# Patient Record
Sex: Female | Born: 1935 | Race: White | Hispanic: No | Marital: Married | Smoking: Never smoker
Health system: Southern US, Community
[De-identification: ages and names within clinical notes are randomized; demographics above are authoritative.]

## PROBLEM LIST (undated history)

## (undated) DIAGNOSIS — C801 Malignant (primary) neoplasm, unspecified: Secondary | ICD-10-CM

## (undated) DIAGNOSIS — E039 Hypothyroidism, unspecified: Secondary | ICD-10-CM

## (undated) DIAGNOSIS — D649 Anemia, unspecified: Secondary | ICD-10-CM

## (undated) DIAGNOSIS — I509 Heart failure, unspecified: Secondary | ICD-10-CM

## (undated) DIAGNOSIS — I10 Essential (primary) hypertension: Secondary | ICD-10-CM

## (undated) DIAGNOSIS — N189 Chronic kidney disease, unspecified: Secondary | ICD-10-CM

## (undated) DIAGNOSIS — K219 Gastro-esophageal reflux disease without esophagitis: Secondary | ICD-10-CM

## (undated) DIAGNOSIS — I82409 Acute embolism and thrombosis of unspecified deep veins of unspecified lower extremity: Secondary | ICD-10-CM

## (undated) DIAGNOSIS — M1990 Unspecified osteoarthritis, unspecified site: Secondary | ICD-10-CM

## (undated) HISTORY — PX: ABDOMINAL HYSTERECTOMY: SHX81

## (undated) HISTORY — PX: CHOLECYSTECTOMY: SHX55

## (undated) HISTORY — PX: EYE SURGERY: SHX253

## (undated) HISTORY — PX: JOINT REPLACEMENT: SHX530

---

## 1998-07-19 DIAGNOSIS — I82409 Acute embolism and thrombosis of unspecified deep veins of unspecified lower extremity: Secondary | ICD-10-CM

## 1998-07-19 HISTORY — DX: Acute embolism and thrombosis of unspecified deep veins of unspecified lower extremity: I82.409

## 2004-10-21 ENCOUNTER — Inpatient Hospital Stay: Payer: Self-pay | Admitting: Unknown Physician Specialty

## 2004-10-27 ENCOUNTER — Encounter: Payer: Self-pay | Admitting: Internal Medicine

## 2004-11-16 ENCOUNTER — Encounter: Payer: Self-pay | Admitting: Internal Medicine

## 2004-12-08 ENCOUNTER — Ambulatory Visit: Payer: Self-pay | Admitting: Unknown Physician Specialty

## 2005-02-18 ENCOUNTER — Ambulatory Visit: Payer: Self-pay | Admitting: Internal Medicine

## 2006-02-25 ENCOUNTER — Ambulatory Visit: Payer: Self-pay | Admitting: Internal Medicine

## 2006-03-01 ENCOUNTER — Ambulatory Visit: Payer: Self-pay | Admitting: Internal Medicine

## 2006-07-28 ENCOUNTER — Other Ambulatory Visit: Payer: Self-pay

## 2006-08-02 ENCOUNTER — Inpatient Hospital Stay: Payer: Self-pay | Admitting: Unknown Physician Specialty

## 2007-03-07 ENCOUNTER — Ambulatory Visit: Payer: Self-pay | Admitting: Internal Medicine

## 2007-04-05 ENCOUNTER — Ambulatory Visit: Payer: Self-pay | Admitting: Internal Medicine

## 2007-07-10 ENCOUNTER — Ambulatory Visit: Payer: Self-pay | Admitting: Urology

## 2008-04-08 ENCOUNTER — Ambulatory Visit: Payer: Self-pay | Admitting: Internal Medicine

## 2008-10-15 ENCOUNTER — Ambulatory Visit: Payer: Self-pay | Admitting: Urology

## 2008-11-01 ENCOUNTER — Ambulatory Visit: Payer: Self-pay | Admitting: Rheumatology

## 2008-12-03 ENCOUNTER — Inpatient Hospital Stay: Payer: Self-pay | Admitting: Surgery

## 2009-04-15 ENCOUNTER — Ambulatory Visit: Payer: Self-pay | Admitting: Internal Medicine

## 2009-09-04 ENCOUNTER — Ambulatory Visit: Payer: Self-pay | Admitting: Ophthalmology

## 2009-09-16 ENCOUNTER — Ambulatory Visit: Payer: Self-pay | Admitting: Ophthalmology

## 2010-04-16 ENCOUNTER — Ambulatory Visit: Payer: Self-pay | Admitting: Internal Medicine

## 2010-06-03 ENCOUNTER — Ambulatory Visit: Payer: Self-pay | Admitting: Gastroenterology

## 2010-09-23 ENCOUNTER — Ambulatory Visit: Payer: Self-pay | Admitting: Urology

## 2011-05-05 ENCOUNTER — Ambulatory Visit: Payer: Self-pay | Admitting: Internal Medicine

## 2012-01-26 ENCOUNTER — Ambulatory Visit: Payer: Self-pay

## 2012-05-05 ENCOUNTER — Ambulatory Visit: Payer: Self-pay | Admitting: Internal Medicine

## 2013-05-07 ENCOUNTER — Ambulatory Visit: Payer: Self-pay | Admitting: Internal Medicine

## 2014-01-05 ENCOUNTER — Ambulatory Visit: Payer: Self-pay | Admitting: Internal Medicine

## 2014-05-13 ENCOUNTER — Ambulatory Visit: Payer: Self-pay | Admitting: Internal Medicine

## 2015-01-08 DIAGNOSIS — M1990 Unspecified osteoarthritis, unspecified site: Secondary | ICD-10-CM | POA: Diagnosis not present

## 2015-01-08 DIAGNOSIS — I10 Essential (primary) hypertension: Secondary | ICD-10-CM | POA: Diagnosis not present

## 2015-01-08 DIAGNOSIS — K219 Gastro-esophageal reflux disease without esophagitis: Secondary | ICD-10-CM | POA: Diagnosis not present

## 2015-01-08 DIAGNOSIS — E079 Disorder of thyroid, unspecified: Secondary | ICD-10-CM | POA: Diagnosis not present

## 2015-01-15 DIAGNOSIS — M159 Polyosteoarthritis, unspecified: Secondary | ICD-10-CM | POA: Diagnosis not present

## 2015-01-15 DIAGNOSIS — I10 Essential (primary) hypertension: Secondary | ICD-10-CM | POA: Diagnosis not present

## 2015-01-15 DIAGNOSIS — E559 Vitamin D deficiency, unspecified: Secondary | ICD-10-CM | POA: Diagnosis not present

## 2015-01-15 DIAGNOSIS — D649 Anemia, unspecified: Secondary | ICD-10-CM | POA: Diagnosis not present

## 2015-01-15 DIAGNOSIS — E039 Hypothyroidism, unspecified: Secondary | ICD-10-CM | POA: Diagnosis not present

## 2015-01-15 DIAGNOSIS — K219 Gastro-esophageal reflux disease without esophagitis: Secondary | ICD-10-CM | POA: Diagnosis not present

## 2015-04-25 DIAGNOSIS — Z23 Encounter for immunization: Secondary | ICD-10-CM | POA: Diagnosis not present

## 2015-08-04 DIAGNOSIS — K219 Gastro-esophageal reflux disease without esophagitis: Secondary | ICD-10-CM | POA: Diagnosis not present

## 2015-08-04 DIAGNOSIS — E559 Vitamin D deficiency, unspecified: Secondary | ICD-10-CM | POA: Diagnosis not present

## 2015-08-04 DIAGNOSIS — E039 Hypothyroidism, unspecified: Secondary | ICD-10-CM | POA: Diagnosis not present

## 2015-08-04 DIAGNOSIS — I10 Essential (primary) hypertension: Secondary | ICD-10-CM | POA: Diagnosis not present

## 2015-08-04 DIAGNOSIS — M159 Polyosteoarthritis, unspecified: Secondary | ICD-10-CM | POA: Diagnosis not present

## 2015-08-04 DIAGNOSIS — D649 Anemia, unspecified: Secondary | ICD-10-CM | POA: Diagnosis not present

## 2015-08-07 ENCOUNTER — Other Ambulatory Visit: Payer: Self-pay | Admitting: Internal Medicine

## 2015-08-07 DIAGNOSIS — Z139 Encounter for screening, unspecified: Secondary | ICD-10-CM | POA: Diagnosis not present

## 2015-08-07 DIAGNOSIS — I10 Essential (primary) hypertension: Secondary | ICD-10-CM | POA: Diagnosis not present

## 2015-08-07 DIAGNOSIS — Z0000 Encounter for general adult medical examination without abnormal findings: Secondary | ICD-10-CM | POA: Diagnosis not present

## 2015-08-07 DIAGNOSIS — E079 Disorder of thyroid, unspecified: Secondary | ICD-10-CM | POA: Diagnosis not present

## 2015-08-07 DIAGNOSIS — Z1231 Encounter for screening mammogram for malignant neoplasm of breast: Secondary | ICD-10-CM

## 2015-08-07 DIAGNOSIS — K219 Gastro-esophageal reflux disease without esophagitis: Secondary | ICD-10-CM | POA: Diagnosis not present

## 2015-08-07 DIAGNOSIS — R413 Other amnesia: Secondary | ICD-10-CM | POA: Diagnosis not present

## 2015-08-07 DIAGNOSIS — R7989 Other specified abnormal findings of blood chemistry: Secondary | ICD-10-CM | POA: Diagnosis not present

## 2015-08-15 ENCOUNTER — Ambulatory Visit: Payer: Self-pay

## 2015-08-19 ENCOUNTER — Emergency Department
Admission: EM | Admit: 2015-08-19 | Discharge: 2015-08-19 | Disposition: A | Discharge status: Home / Self Care | Payer: Commercial Managed Care - HMO | Attending: Emergency Medicine | Admitting: Emergency Medicine

## 2015-08-19 ENCOUNTER — Emergency Department: Payer: Commercial Managed Care - HMO

## 2015-08-19 ENCOUNTER — Encounter: Payer: Self-pay | Admitting: Emergency Medicine

## 2015-08-19 DIAGNOSIS — F419 Anxiety disorder, unspecified: Secondary | ICD-10-CM | POA: Insufficient documentation

## 2015-08-19 DIAGNOSIS — F439 Reaction to severe stress, unspecified: Secondary | ICD-10-CM | POA: Insufficient documentation

## 2015-08-19 DIAGNOSIS — R079 Chest pain, unspecified: Secondary | ICD-10-CM | POA: Insufficient documentation

## 2015-08-19 DIAGNOSIS — I10 Essential (primary) hypertension: Secondary | ICD-10-CM | POA: Insufficient documentation

## 2015-08-19 HISTORY — DX: Essential (primary) hypertension: I10

## 2015-08-19 LAB — BASIC METABOLIC PANEL
ANION GAP: 6 (ref 5–15)
BUN: 18 mg/dL (ref 6–20)
CALCIUM: 9.6 mg/dL (ref 8.9–10.3)
CHLORIDE: 110 mmol/L (ref 101–111)
CO2: 27 mmol/L (ref 22–32)
Creatinine, Ser: 0.98 mg/dL (ref 0.44–1.00)
GFR calc non Af Amer: 53 mL/min — ABNORMAL LOW (ref >60)
Glucose, Bld: 112 mg/dL — ABNORMAL HIGH (ref 65–99)
Potassium: 3.6 mmol/L (ref 3.5–5.1)
Sodium: 143 mmol/L (ref 135–145)

## 2015-08-19 LAB — CBC
HCT: 39.7 % (ref 35.0–47.0)
HEMOGLOBIN: 13.2 g/dL (ref 12.0–16.0)
MCH: 31.5 pg (ref 26.0–34.0)
MCHC: 33.3 g/dL (ref 32.0–36.0)
MCV: 94.7 fL (ref 80.0–100.0)
Platelets: 176 10*3/uL (ref 150–440)
RBC: 4.19 MIL/uL (ref 3.80–5.20)
RDW: 13.9 % (ref 11.5–14.5)
WBC: 7.4 10*3/uL (ref 3.6–11.0)

## 2015-08-19 LAB — TROPONIN I: Troponin I: <0.03 ng/mL (ref <0.031)

## 2015-08-19 MED ORDER — IOHEXOL 350 MG/ML SOLN
100.0000 mL | Freq: Once | INTRAVENOUS | Status: AC | PRN
  Administered 2015-08-19: 100 mL via INTRAVENOUS
  Filled 2015-08-19: qty 100

## 2015-08-19 MED ORDER — FAMOTIDINE 20 MG PO TABS
ORAL_TABLET | ORAL | Status: AC
  Administered 2015-08-19: 20 mg via ORAL
  Filled 2015-08-19: qty 1

## 2015-08-19 MED ORDER — SUCRALFATE 1 G PO TABS: 1.0000 g | ORAL_TABLET | Freq: Four times a day (QID) | ORAL | Status: DC

## 2015-08-19 MED ORDER — RANITIDINE HCL 150 MG PO TABS: 150.0000 mg | ORAL_TABLET | Freq: Two times a day (BID) | ORAL | Status: DC

## 2015-08-19 MED ORDER — RANITIDINE HCL 150 MG/10ML PO SYRP
150.0000 mg | ORAL_SOLUTION | Freq: Once | ORAL | Status: DC
Start: 2015-08-19 — End: 2015-08-19

## 2015-08-19 MED ORDER — SUCRALFATE 1 G PO TABS
1.0000 g | ORAL_TABLET | Freq: Once | ORAL | Status: AC
  Administered 2015-08-19: 1 g via ORAL

## 2015-08-19 MED ORDER — SUCRALFATE 1 G PO TABS
ORAL_TABLET | ORAL | Status: AC
  Administered 2015-08-19: 1 g via ORAL
  Filled 2015-08-19: qty 1

## 2015-08-19 MED ORDER — ACETAMINOPHEN 500 MG PO TABS
1000.0000 mg | ORAL_TABLET | Freq: Once | ORAL | Status: AC
  Administered 2015-08-19: 1000 mg via ORAL

## 2015-08-19 MED ORDER — LORAZEPAM 1 MG PO TABS
2.0000 mg | ORAL_TABLET | Freq: Once | ORAL | Status: AC
  Administered 2015-08-19: 2 mg via ORAL

## 2015-08-19 MED ORDER — GI COCKTAIL ~~LOC~~
30.0000 mL | Freq: Once | ORAL | Status: AC
  Administered 2015-08-19: 30 mL via ORAL
  Filled 2015-08-19: qty 30

## 2015-08-19 MED ORDER — LORAZEPAM 1 MG PO TABS
ORAL_TABLET | ORAL | Status: AC
  Filled 2015-08-19: qty 2

## 2015-08-19 MED ORDER — FAMOTIDINE 20 MG PO TABS
20.0000 mg | ORAL_TABLET | Freq: Once | ORAL | Status: AC
  Administered 2015-08-19: 20 mg via ORAL

## 2015-08-19 MED ORDER — ACETAMINOPHEN 500 MG PO TABS
ORAL_TABLET | ORAL | Status: AC
  Administered 2015-08-19: 1000 mg via ORAL
  Filled 2015-08-19: qty 2

## 2015-08-19 NOTE — ED Notes (Addendum)
Onset chest pain this am while eating. Says pain back of head.

## 2015-08-19 NOTE — ED Provider Notes (Signed)
[facility] Emergency Department Provider Note   ____________________________________________  Time seen: ~1445  I have reviewed the triage vital signs and the nursing notes.   HISTORY  Chief Complaint Chest Pain   History limited by: Not Limited   HPI Anna Quinn is a 82 y.o. female who presents to the emergency department today because of concerns for chest pain. She states it is located centrally. She states the sharp. She had some radiation up into her neck. It is severe. It has been fairly constant today. She did have some pain yesterday. She has been under a lot of stress recently with the death of her son and a custody battle with the son's ex-wife over grandchildren. She did have to go to court yesterday. She denies any shortness of breath. Denies any fevers. Denies similar symptoms in the past.     Past Medical History  Diagnosis Date  . Hypertension     There are no active problems to display for this patient.   No past surgical history on file.  No current outpatient prescriptions on file.  Allergies Review of patient's allergies indicates no known allergies.  No family history on file.  Social History Social History  Substance Use Topics  . Smoking status: Never Smoker   . Smokeless tobacco: None  . Alcohol Use: No    Review of Systems  Constitutional: Negative for fever. Cardiovascular: Positive for chest pain. Respiratory: Negative for shortness of breath. Gastrointestinal: Negative for abdominal pain, vomiting and diarrhea. Neurological: Negative for headaches, focal weakness or numbness.   10-point ROS otherwise negative.  ____________________________________________   PHYSICAL EXAM:  VITAL SIGNS: ED Triage Vitals  Enc Vitals Group     BP 08/19/15 1053 181/75 mmHg     Pulse Rate 08/19/15 1053 66     Resp 08/19/15 1053 18     Temp 08/19/15 1053 98.9 F (37.2 C)     Temp Source 08/19/15 1053 Oral      SpO2 08/19/15 1053 100 %     Weight 08/19/15 1053 175 lb (79.379 kg)     Height 08/19/15 1053 5\' 5"  (1.651 m)     Head Cir --      Peak Flow --      Pain Score 08/19/15 1054 8   Constitutional: Alert and oriented. Well appearing and in no distress. Eyes: Conjunctivae are normal. PERRL. Normal extraocular movements. ENT   Head: Normocephalic and atraumatic.   Nose: No congestion/rhinnorhea.   Mouth/Throat: Mucous membranes are moist.   Neck: No stridor. Hematological/Lymphatic/Immunilogical: No cervical lymphadenopathy. Cardiovascular: Normal rate, regular rhythm.  No murmurs, rubs, or gallops. Respiratory: Normal respiratory effort without tachypnea nor retractions. Breath sounds are clear and equal bilaterally. No wheezes/rales/rhonchi. Gastrointestinal: Soft and nontender. No distention. There is no CVA tenderness. Genitourinary: Deferred Musculoskeletal: Normal range of motion in all extremities. No joint effusions.  No lower extremity tenderness nor edema. Neurologic:  Normal speech and language. No gross focal neurologic deficits are appreciated.  Skin:  Skin is warm, dry and intact. No rash noted. Psychiatric: Mood and affect are normal. Speech and behavior are normal. Patient exhibits appropriate insight and judgment.  ____________________________________________    LABS (pertinent positives/negatives)  Labs Reviewed  BASIC METABOLIC PANEL - Abnormal; Notable for the following:    Glucose, Bld 112 (*)    GFR calc non Af Amer 53 (*)    All other components within normal limits  CBC  TROPONIN I  TROPONIN I  ____________________________________________   EKG  Apolonio Schneiders, attending physician, personally viewed and interpreted this EKG  EKG Time: 1051 Rate: 68 Rhythm: normal sinus rhythm Axis: left axis deviation Intervals: qtc 431 QRS: LAFB, LVH ST changes: no st elevation Impression: abnormal  ekg   ____________________________________________    RADIOLOGY  CXR  IMPRESSION: 1. Mild enlargement of the cardiopericardial silhouette, without edema. 2. Chronic scarring at the left lung base and left lung apex.  CT angio IMPRESSION: 1. No evidence of an aortic dissection. 2. No acute findings. 3. 4.7 mm sub solid nodule in the right lower lobe. Initial follow-up by chest CT without contrast is recommended in 3 months to confirm persistence. This recommendation follows the consensus statement: Recommendations for the Management of Subsolid Pulmonary Nodules Detected at CT: A Statement from the [place] as published in Radiology 2013; 266:304-317.  ____________________________________________   PROCEDURES  Procedure(s) performed: None  Critical Care performed: No  ____________________________________________   INITIAL IMPRESSION / ASSESSMENT AND PLAN / ED COURSE  Pertinent labs & imaging results that were available during my care of the patient were reviewed by me and considered in my medical decision making (see chart for details).  Patient presented to the emergency department today because of chest pain. Patient has been under a lot of stress recently with the unexpected death of her son. She is also had. In [place] for custody issues. Patient had 2 negative troponins here. EKG without concerning findings. I did perform a CT angiogram to make sure her aorta was not the source of the pain. Patient had some minimal relief with a GI cocktail. I do think given the negative workup here that a lot of the pain is likely secondary to anxiety and gastritis. I did discuss this with patient and family. Will plan on giving a couple medications to help with gastritis. Did advise the patient to start taking the benzodiazepine that has been prescribed to her. Furthermore I did discuss with the husband and son the finding of the CT scan of the nodule. Chose not to discuss  with the patient given other causes of stress.  ____________________________________________   FINAL CLINICAL IMPRESSION(S) / ED DIAGNOSES  Final diagnoses:  Chest pain, unspecified chest pain type  Anxiety     Nance Pear, MD 08/19/15 2102

## 2015-08-19 NOTE — Discharge Instructions (Signed)
Please seek medical attention for any high fevers, chest pain, shortness of breath, change in behavior, persistent vomiting, bloody stool or any other new or concerning symptoms.   Nonspecific Chest Pain  Chest pain can be caused by many different conditions. There is always a chance that your pain could be related to something serious, such as a heart attack or a blood clot in your lungs. Chest pain can also be caused by conditions that are not life-threatening. If you have chest pain, it is very important to follow up with your health care provider. CAUSES  Chest pain can be caused by:  Heartburn.  Pneumonia or bronchitis.  Anxiety or stress.  Inflammation around your heart (pericarditis) or lung (pleuritis or pleurisy).  A blood clot in your lung.  A collapsed lung (pneumothorax). It can develop suddenly on its own (spontaneous pneumothorax) or from trauma to the chest.  Shingles infection (varicella-zoster virus).  Heart attack.  Damage to the bones, muscles, and cartilage that make up your chest wall. This can include:  Bruised bones due to injury.  Strained muscles or cartilage due to frequent or repeated coughing or overwork.  Fracture to one or more ribs.  Sore cartilage due to inflammation (costochondritis). RISK FACTORS  Risk factors for chest pain may include:  Activities that increase your risk for trauma or injury to your chest.  Respiratory infections or conditions that cause frequent coughing.  Medical conditions or overeating that can cause heartburn.  Heart disease or family history of heart disease.  Conditions or health behaviors that increase your risk of developing a blood clot.  Having had chicken pox (varicella zoster). SIGNS AND SYMPTOMS Chest pain can feel like:  Burning or tingling on the surface of your chest or deep in your chest.  Crushing, pressure, aching, or squeezing pain.  Dull or sharp pain that is worse when you move, cough, or  take a deep breath.  Pain that is also felt in your back, neck, shoulder, or arm, or pain that spreads to any of these areas. Your chest pain may come and go, or it may stay constant. DIAGNOSIS Lab tests or other studies may be needed to find the cause of your pain. Your health care provider may have you take a test called an ambulatory ECG (electrocardiogram). An ECG records your heartbeat patterns at the time the test is performed. You may also have other tests, such as:  Transthoracic echocardiogram (TTE). During echocardiography, sound waves are used to create a picture of all of the heart structures and to look at how blood flows through your heart.  Transesophageal echocardiogram (TEE).This is a more advanced imaging test that obtains images from inside your body. It allows your health care provider to see your heart in finer detail.  Cardiac monitoring. This allows your health care provider to monitor your heart rate and rhythm in real time.  Holter monitor. This is a portable device that records your heartbeat and can help to diagnose abnormal heartbeats. It allows your health care provider to track your heart activity for several days, if needed.  Stress tests. These can be done through exercise or by taking medicine that makes your heart beat more quickly.  Blood tests.  Imaging tests. TREATMENT  Your treatment depends on what is causing your chest pain. Treatment may include:  Medicines. These may include:  Acid blockers for heartburn.  Anti-inflammatory medicine.  Pain medicine for inflammatory conditions.  Antibiotic medicine, if an infection is present.  Medicines  to dissolve blood clots.  Medicines to treat coronary artery disease.  Supportive care for conditions that do not require medicines. This may include:  Resting.  Applying heat or cold packs to injured areas.  Limiting activities until pain decreases. HOME CARE INSTRUCTIONS  If you were prescribed  an antibiotic medicine, finish it all even if you start to feel better.  Avoid any activities that bring on chest pain.  Do not use any tobacco products, including cigarettes, chewing tobacco, or electronic cigarettes. If you need help quitting, ask your health care provider.  Do not drink alcohol.  Take medicines only as directed by your health care provider.  Keep all follow-up visits as directed by your health care provider. This is important. This includes any further testing if your chest pain does not go away.  If heartburn is the cause for your chest pain, you may be told to keep your head raised (elevated) while sleeping. This reduces the chance that acid will go from your stomach into your esophagus.  Make lifestyle changes as directed by your health care provider. These may include:  Getting regular exercise. Ask your health care provider to suggest some activities that are safe for you.  Eating a heart-healthy diet. A registered dietitian can help you to learn healthy eating options.  Maintaining a healthy weight.  Managing diabetes, if necessary.  Reducing stress. SEEK MEDICAL CARE IF:  Your chest pain does not go away after treatment.  You have a rash with blisters on your chest.  You have a fever. SEEK IMMEDIATE MEDICAL CARE IF:   Your chest pain is worse.  You have an increasing cough, or you cough up blood.  You have severe abdominal pain.  You have severe weakness.  You faint.  You have chills.  You have sudden, unexplained chest discomfort.  You have sudden, unexplained discomfort in your arms, back, neck, or jaw.  You have shortness of breath at any time.  You suddenly start to sweat, or your skin gets clammy.  You feel nauseous or you vomit.  You suddenly feel light-headed or dizzy.  Your heart begins to beat quickly, or it feels like it is skipping beats. These symptoms may represent a serious problem that is an emergency. Do not wait to  see if the symptoms will go away. Get medical help right away. Call your local emergency services (911 in the [place] Do not drive yourself to the hospital.   This information is not intended to replace advice given to you by your health care provider. Make sure you discuss any questions you have with your health care provider.   Document Released: 04/14/2005 Document Revised: 07/26/2014 Document Reviewed: 02/08/2014 Elsevier Interactive Patient Education 2016 [employer] and Stress Management Stress is a normal reaction to life events. It is what you feel when life demands more than you are used to or more than you can handle. Some stress can be useful. For example, the stress reaction can help you catch the last bus of the day, study for a test, or meet a deadline at work. But stress that occurs too often or for too long can cause problems. It can affect your emotional health and interfere with relationships and normal daily activities. Too much stress can weaken your immune system and increase your risk for physical illness. If you already have a medical problem, stress can make it worse. CAUSES  All sorts of life events may cause stress. An event that causes stress  for one person may not be stressful for another person. Major life events commonly cause stress. These may be positive or negative. Examples include losing your job, moving into a new home, getting married, having a baby, or losing a loved one. Less obvious life events may also cause stress, especially if they occur day after day or in combination. Examples include working long hours, driving in traffic, caring for children, being in debt, or being in a difficult relationship. SIGNS AND SYMPTOMS Stress may cause emotional symptoms including, the following:  Anxiety. This is feeling worried, afraid, on edge, overwhelmed, or out of control.  Anger. This is feeling irritated or impatient.  Depression. This is feeling sad, down,  helpless, or guilty.  Difficulty focusing, remembering, or making decisions. Stress may cause physical symptoms, including the following:   Aches and pains. These may affect your head, neck, back, stomach, or other areas of your body.  Tight muscles or clenched jaw.  Low energy or trouble sleeping. Stress may cause unhealthy behaviors, including the following:   Eating to feel better (overeating) or skipping meals.  Sleeping too little, too much, or both.  Working too much or putting off tasks (procrastination).  Smoking, drinking alcohol, or using drugs to feel better. DIAGNOSIS  Stress is diagnosed through an assessment by your health care provider. Your health care provider will ask questions about your symptoms and any stressful life events.Your health care provider will also ask about your medical history and may order blood tests or other tests. Certain medical conditions and medicine can cause physical symptoms similar to stress. Mental illness can cause emotional symptoms and unhealthy behaviors similar to stress. Your health care provider may refer you to a mental health professional for further evaluation.  TREATMENT  Stress management is the recommended treatment for stress.The goals of stress management are reducing stressful life events and coping with stress in healthy ways.  Techniques for reducing stressful life events include the following:  Stress identification. Self-monitor for stress and identify what causes stress for you. These skills may help you to avoid some stressful events.  Time management. Set your priorities, keep a calendar of events, and learn to say "no." These tools can help you avoid making too many commitments. Techniques for coping with stress include the following:  Rethinking the problem. Try to think realistically about stressful events rather than ignoring them or overreacting. Try to find the positives in a stressful situation rather than  focusing on the negatives.  Exercise. Physical exercise can release both physical and emotional tension. The key is to find a form of exercise you enjoy and do it regularly.  Relaxation techniques. These relax the body and mind. Examples include yoga, meditation, tai chi, biofeedback, deep breathing, progressive muscle relaxation, listening to music, being out in nature, journaling, and other hobbies. Again, the key is to find one or more that you enjoy and can do regularly.  Healthy lifestyle. Eat a balanced diet, get plenty of sleep, and do not smoke. Avoid using alcohol or drugs to relax.  Strong support network. Spend time with family, friends, or other people you enjoy being around.Express your feelings and talk things over with someone you trust. Counseling or talktherapy with a mental health professional may be helpful if you are having difficulty managing stress on your own. Medicine is typically not recommended for the treatment of stress.Talk to your health care provider if you think you need medicine for symptoms of stress. HOME CARE INSTRUCTIONS  Keep  all follow-up visits as directed by your health care provider.  Take all medicines as directed by your health care provider. SEEK MEDICAL CARE IF:  Your symptoms get worse or you start having new symptoms.  You feel overwhelmed by your problems and can no longer manage them on your own. SEEK IMMEDIATE MEDICAL CARE IF:  You feel like hurting yourself or someone else.   This information is not intended to replace advice given to you by your health care provider. Make sure you discuss any questions you have with your health care provider.   Document Released: 12/29/2000 Document Revised: 07/26/2014 Document Reviewed: 02/27/2013 Elsevier Interactive Patient Education [employer].

## 2015-08-22 ENCOUNTER — Other Ambulatory Visit: Payer: Self-pay | Admitting: Internal Medicine

## 2015-08-22 ENCOUNTER — Ambulatory Visit
Admission: RE | Admit: 2015-08-22 | Discharge: 2015-08-22 | Disposition: A | Discharge status: Home / Self Care | Payer: Commercial Managed Care - HMO | Source: Ambulatory Visit | Attending: Internal Medicine | Admitting: Internal Medicine

## 2015-08-22 DIAGNOSIS — Z1231 Encounter for screening mammogram for malignant neoplasm of breast: Secondary | ICD-10-CM

## 2015-11-28 DIAGNOSIS — I10 Essential (primary) hypertension: Secondary | ICD-10-CM | POA: Diagnosis not present

## 2015-11-28 DIAGNOSIS — R7989 Other specified abnormal findings of blood chemistry: Secondary | ICD-10-CM | POA: Diagnosis not present

## 2015-11-28 DIAGNOSIS — R413 Other amnesia: Secondary | ICD-10-CM | POA: Diagnosis not present

## 2015-11-28 DIAGNOSIS — E079 Disorder of thyroid, unspecified: Secondary | ICD-10-CM | POA: Diagnosis not present

## 2015-11-28 DIAGNOSIS — K219 Gastro-esophageal reflux disease without esophagitis: Secondary | ICD-10-CM | POA: Diagnosis not present

## 2015-11-28 DIAGNOSIS — Z0000 Encounter for general adult medical examination without abnormal findings: Secondary | ICD-10-CM | POA: Diagnosis not present

## 2015-11-28 DIAGNOSIS — Z139 Encounter for screening, unspecified: Secondary | ICD-10-CM | POA: Diagnosis not present

## 2015-12-11 DIAGNOSIS — M1990 Unspecified osteoarthritis, unspecified site: Secondary | ICD-10-CM | POA: Diagnosis not present

## 2015-12-11 DIAGNOSIS — F411 Generalized anxiety disorder: Secondary | ICD-10-CM | POA: Insufficient documentation

## 2015-12-11 DIAGNOSIS — E079 Disorder of thyroid, unspecified: Secondary | ICD-10-CM | POA: Diagnosis not present

## 2015-12-11 DIAGNOSIS — R413 Other amnesia: Secondary | ICD-10-CM | POA: Diagnosis not present

## 2015-12-11 DIAGNOSIS — K219 Gastro-esophageal reflux disease without esophagitis: Secondary | ICD-10-CM | POA: Diagnosis not present

## 2015-12-11 DIAGNOSIS — I10 Essential (primary) hypertension: Secondary | ICD-10-CM | POA: Diagnosis not present

## 2016-01-09 DIAGNOSIS — M47816 Spondylosis without myelopathy or radiculopathy, lumbar region: Secondary | ICD-10-CM | POA: Diagnosis not present

## 2016-01-09 DIAGNOSIS — G8929 Other chronic pain: Secondary | ICD-10-CM | POA: Diagnosis not present

## 2016-01-09 DIAGNOSIS — M545 Low back pain: Secondary | ICD-10-CM | POA: Diagnosis not present

## 2016-04-07 DIAGNOSIS — E079 Disorder of thyroid, unspecified: Secondary | ICD-10-CM | POA: Diagnosis not present

## 2016-04-07 DIAGNOSIS — F411 Generalized anxiety disorder: Secondary | ICD-10-CM | POA: Diagnosis not present

## 2016-04-07 DIAGNOSIS — R413 Other amnesia: Secondary | ICD-10-CM | POA: Diagnosis not present

## 2016-04-07 DIAGNOSIS — K219 Gastro-esophageal reflux disease without esophagitis: Secondary | ICD-10-CM | POA: Diagnosis not present

## 2016-04-07 DIAGNOSIS — M1990 Unspecified osteoarthritis, unspecified site: Secondary | ICD-10-CM | POA: Diagnosis not present

## 2016-04-07 DIAGNOSIS — I10 Essential (primary) hypertension: Secondary | ICD-10-CM | POA: Diagnosis not present

## 2016-04-13 DIAGNOSIS — I10 Essential (primary) hypertension: Secondary | ICD-10-CM | POA: Diagnosis not present

## 2016-04-13 DIAGNOSIS — Z0000 Encounter for general adult medical examination without abnormal findings: Secondary | ICD-10-CM | POA: Diagnosis not present

## 2016-04-13 DIAGNOSIS — F411 Generalized anxiety disorder: Secondary | ICD-10-CM | POA: Diagnosis not present

## 2016-04-13 DIAGNOSIS — Z23 Encounter for immunization: Secondary | ICD-10-CM | POA: Diagnosis not present

## 2016-04-13 DIAGNOSIS — K219 Gastro-esophageal reflux disease without esophagitis: Secondary | ICD-10-CM | POA: Diagnosis not present

## 2016-04-13 DIAGNOSIS — M65332 Trigger finger, left middle finger: Secondary | ICD-10-CM | POA: Diagnosis not present

## 2016-04-13 DIAGNOSIS — E079 Disorder of thyroid, unspecified: Secondary | ICD-10-CM | POA: Diagnosis not present

## 2016-04-13 DIAGNOSIS — M1990 Unspecified osteoarthritis, unspecified site: Secondary | ICD-10-CM | POA: Diagnosis not present

## 2016-04-13 DIAGNOSIS — R413 Other amnesia: Secondary | ICD-10-CM | POA: Diagnosis not present

## 2016-08-09 DIAGNOSIS — M1990 Unspecified osteoarthritis, unspecified site: Secondary | ICD-10-CM | POA: Diagnosis not present

## 2016-08-09 DIAGNOSIS — R413 Other amnesia: Secondary | ICD-10-CM | POA: Diagnosis not present

## 2016-08-09 DIAGNOSIS — E079 Disorder of thyroid, unspecified: Secondary | ICD-10-CM | POA: Diagnosis not present

## 2016-08-09 DIAGNOSIS — Z0000 Encounter for general adult medical examination without abnormal findings: Secondary | ICD-10-CM | POA: Diagnosis not present

## 2016-08-09 DIAGNOSIS — K219 Gastro-esophageal reflux disease without esophagitis: Secondary | ICD-10-CM | POA: Diagnosis not present

## 2016-08-09 DIAGNOSIS — I10 Essential (primary) hypertension: Secondary | ICD-10-CM | POA: Diagnosis not present

## 2016-08-09 DIAGNOSIS — M65332 Trigger finger, left middle finger: Secondary | ICD-10-CM | POA: Diagnosis not present

## 2016-08-09 DIAGNOSIS — F411 Generalized anxiety disorder: Secondary | ICD-10-CM | POA: Diagnosis not present

## 2016-08-12 ENCOUNTER — Other Ambulatory Visit: Payer: Self-pay | Admitting: Internal Medicine

## 2016-08-12 DIAGNOSIS — I10 Essential (primary) hypertension: Secondary | ICD-10-CM | POA: Diagnosis not present

## 2016-08-12 DIAGNOSIS — Z1231 Encounter for screening mammogram for malignant neoplasm of breast: Secondary | ICD-10-CM | POA: Diagnosis not present

## 2016-08-12 DIAGNOSIS — Z0000 Encounter for general adult medical examination without abnormal findings: Secondary | ICD-10-CM | POA: Diagnosis not present

## 2016-08-12 DIAGNOSIS — K219 Gastro-esophageal reflux disease without esophagitis: Secondary | ICD-10-CM | POA: Diagnosis not present

## 2016-08-12 DIAGNOSIS — D649 Anemia, unspecified: Secondary | ICD-10-CM | POA: Diagnosis not present

## 2016-08-12 DIAGNOSIS — F411 Generalized anxiety disorder: Secondary | ICD-10-CM | POA: Diagnosis not present

## 2016-08-12 DIAGNOSIS — E079 Disorder of thyroid, unspecified: Secondary | ICD-10-CM | POA: Diagnosis not present

## 2016-08-12 DIAGNOSIS — R413 Other amnesia: Secondary | ICD-10-CM | POA: Diagnosis not present

## 2016-08-12 DIAGNOSIS — M159 Polyosteoarthritis, unspecified: Secondary | ICD-10-CM | POA: Diagnosis not present

## 2016-09-10 ENCOUNTER — Ambulatory Visit
Admission: RE | Admit: 2016-09-10 | Discharge: 2016-09-10 | Disposition: A | Discharge status: Home / Self Care | Payer: Commercial Managed Care - HMO | Source: Ambulatory Visit | Attending: Internal Medicine | Admitting: Internal Medicine

## 2016-09-10 DIAGNOSIS — Z1231 Encounter for screening mammogram for malignant neoplasm of breast: Secondary | ICD-10-CM | POA: Insufficient documentation

## 2017-01-11 DIAGNOSIS — K625 Hemorrhage of anus and rectum: Secondary | ICD-10-CM | POA: Diagnosis not present

## 2017-01-11 DIAGNOSIS — K5792 Diverticulitis of intestine, part unspecified, without perforation or abscess without bleeding: Secondary | ICD-10-CM | POA: Diagnosis not present

## 2017-01-11 DIAGNOSIS — R1032 Left lower quadrant pain: Secondary | ICD-10-CM | POA: Diagnosis not present

## 2017-01-11 DIAGNOSIS — I10 Essential (primary) hypertension: Secondary | ICD-10-CM | POA: Diagnosis not present

## 2017-01-11 DIAGNOSIS — F411 Generalized anxiety disorder: Secondary | ICD-10-CM | POA: Diagnosis not present

## 2017-01-11 DIAGNOSIS — R1031 Right lower quadrant pain: Secondary | ICD-10-CM | POA: Diagnosis not present

## 2017-01-31 DIAGNOSIS — H35371 Puckering of macula, right eye: Secondary | ICD-10-CM | POA: Diagnosis not present

## 2017-01-31 DIAGNOSIS — H2512 Age-related nuclear cataract, left eye: Secondary | ICD-10-CM | POA: Diagnosis not present

## 2017-02-02 DIAGNOSIS — D649 Anemia, unspecified: Secondary | ICD-10-CM | POA: Diagnosis not present

## 2017-02-02 DIAGNOSIS — Z0000 Encounter for general adult medical examination without abnormal findings: Secondary | ICD-10-CM | POA: Diagnosis not present

## 2017-02-02 DIAGNOSIS — E079 Disorder of thyroid, unspecified: Secondary | ICD-10-CM | POA: Diagnosis not present

## 2017-02-02 DIAGNOSIS — M159 Polyosteoarthritis, unspecified: Secondary | ICD-10-CM | POA: Diagnosis not present

## 2017-02-02 DIAGNOSIS — I10 Essential (primary) hypertension: Secondary | ICD-10-CM | POA: Diagnosis not present

## 2017-02-02 DIAGNOSIS — R413 Other amnesia: Secondary | ICD-10-CM | POA: Diagnosis not present

## 2017-02-09 DIAGNOSIS — D649 Anemia, unspecified: Secondary | ICD-10-CM | POA: Insufficient documentation

## 2017-02-09 DIAGNOSIS — R413 Other amnesia: Secondary | ICD-10-CM | POA: Diagnosis not present

## 2017-02-09 DIAGNOSIS — K219 Gastro-esophageal reflux disease without esophagitis: Secondary | ICD-10-CM | POA: Diagnosis not present

## 2017-02-09 DIAGNOSIS — M1990 Unspecified osteoarthritis, unspecified site: Secondary | ICD-10-CM | POA: Diagnosis not present

## 2017-02-09 DIAGNOSIS — Z0000 Encounter for general adult medical examination without abnormal findings: Secondary | ICD-10-CM | POA: Diagnosis not present

## 2017-02-09 DIAGNOSIS — E079 Disorder of thyroid, unspecified: Secondary | ICD-10-CM | POA: Diagnosis not present

## 2017-02-09 DIAGNOSIS — I10 Essential (primary) hypertension: Secondary | ICD-10-CM | POA: Diagnosis not present

## 2017-02-22 DIAGNOSIS — H2512 Age-related nuclear cataract, left eye: Secondary | ICD-10-CM | POA: Diagnosis not present

## 2017-03-01 ENCOUNTER — Encounter: Payer: Self-pay | Admitting: *Deleted

## 2017-03-08 ENCOUNTER — Ambulatory Visit: Payer: Medicare HMO | Admitting: Anesthesiology

## 2017-03-08 ENCOUNTER — Encounter: Payer: Self-pay | Admitting: Anesthesiology

## 2017-03-08 ENCOUNTER — Ambulatory Visit
Admission: RE | Admit: 2017-03-08 | Discharge: 2017-03-08 | Disposition: A | Discharge status: Home / Self Care | Payer: Medicare HMO | Source: Ambulatory Visit | Attending: Ophthalmology | Admitting: Ophthalmology

## 2017-03-08 ENCOUNTER — Encounter
Admission: RE | Disposition: A | Discharge status: Home / Self Care | Payer: Self-pay | Source: Ambulatory Visit | Attending: Ophthalmology

## 2017-03-08 DIAGNOSIS — I10 Essential (primary) hypertension: Secondary | ICD-10-CM | POA: Diagnosis not present

## 2017-03-08 DIAGNOSIS — E039 Hypothyroidism, unspecified: Secondary | ICD-10-CM | POA: Diagnosis not present

## 2017-03-08 DIAGNOSIS — Z7982 Long term (current) use of aspirin: Secondary | ICD-10-CM | POA: Diagnosis not present

## 2017-03-08 DIAGNOSIS — I110 Hypertensive heart disease with heart failure: Secondary | ICD-10-CM | POA: Diagnosis not present

## 2017-03-08 DIAGNOSIS — Z86718 Personal history of other venous thrombosis and embolism: Secondary | ICD-10-CM | POA: Diagnosis not present

## 2017-03-08 DIAGNOSIS — I509 Heart failure, unspecified: Secondary | ICD-10-CM | POA: Diagnosis not present

## 2017-03-08 DIAGNOSIS — D649 Anemia, unspecified: Secondary | ICD-10-CM | POA: Insufficient documentation

## 2017-03-08 DIAGNOSIS — Z79899 Other long term (current) drug therapy: Secondary | ICD-10-CM | POA: Diagnosis not present

## 2017-03-08 DIAGNOSIS — H2512 Age-related nuclear cataract, left eye: Secondary | ICD-10-CM | POA: Diagnosis not present

## 2017-03-08 HISTORY — DX: Heart failure, unspecified: I50.9

## 2017-03-08 HISTORY — DX: Unspecified osteoarthritis, unspecified site: M19.90

## 2017-03-08 HISTORY — DX: Gastro-esophageal reflux disease without esophagitis: K21.9

## 2017-03-08 HISTORY — DX: Hypothyroidism, unspecified: E03.9

## 2017-03-08 HISTORY — DX: Anemia, unspecified: D64.9

## 2017-03-08 HISTORY — DX: Acute embolism and thrombosis of unspecified deep veins of unspecified lower extremity: I82.409

## 2017-03-08 HISTORY — DX: Chronic kidney disease, unspecified: N18.9

## 2017-03-08 HISTORY — PX: CATARACT EXTRACTION W/PHACO: SHX586

## 2017-03-08 SURGERY — PHACOEMULSIFICATION, CATARACT, WITH IOL INSERTION
Anesthesia: Monitor Anesthesia Care | Site: Eye | Laterality: Left | Wound class: Clean

## 2017-03-08 MED ORDER — ONDANSETRON HCL 4 MG/2ML IJ SOLN
4.0000 mg | Freq: Once | INTRAMUSCULAR | Status: DC | PRN
Start: 2017-03-08 — End: 2017-03-08

## 2017-03-08 MED ORDER — LIDOCAINE HCL (PF) 4 % IJ SOLN
INTRAMUSCULAR | Status: DC | PRN
  Administered 2017-03-08: 4 mL via OPHTHALMIC

## 2017-03-08 MED ORDER — MOXIFLOXACIN HCL 0.5 % OP SOLN
OPHTHALMIC | Status: AC
  Filled 2017-03-08: qty 3

## 2017-03-08 MED ORDER — POVIDONE-IODINE 5 % OP SOLN
OPHTHALMIC | Status: DC | PRN
  Administered 2017-03-08: 1 via OPHTHALMIC

## 2017-03-08 MED ORDER — MOXIFLOXACIN HCL 0.5 % OP SOLN
OPHTHALMIC | Status: DC | PRN
  Administered 2017-03-08: 0.2 mL via OPHTHALMIC

## 2017-03-08 MED ORDER — CARBACHOL 0.01 % IO SOLN
INTRAOCULAR | Status: DC | PRN
  Administered 2017-03-08: 0.5 mL via INTRAOCULAR

## 2017-03-08 MED ORDER — EPINEPHRINE PF 1 MG/ML IJ SOLN
INTRAMUSCULAR | Status: AC
  Filled 2017-03-08: qty 2

## 2017-03-08 MED ORDER — POVIDONE-IODINE 5 % OP SOLN
OPHTHALMIC | Status: AC
  Filled 2017-03-08: qty 30

## 2017-03-08 MED ORDER — MOXIFLOXACIN HCL 0.5 % OP SOLN: 1.0000 [drp] | OPHTHALMIC | Status: DC | PRN

## 2017-03-08 MED ORDER — ARMC OPHTHALMIC DILATING DROPS
OPHTHALMIC | Status: AC
  Administered 2017-03-08: 1 via OPHTHALMIC
  Filled 2017-03-08: qty 0.4

## 2017-03-08 MED ORDER — ARMC OPHTHALMIC DILATING DROPS
1.0000 "application " | OPHTHALMIC | Status: AC
  Administered 2017-03-08 (×3): 1 via OPHTHALMIC

## 2017-03-08 MED ORDER — FENTANYL CITRATE (PF) 100 MCG/2ML IJ SOLN
INTRAMUSCULAR | Status: DC | PRN
  Administered 2017-03-08: 25 ug via INTRAVENOUS

## 2017-03-08 MED ORDER — EPINEPHRINE PF 1 MG/ML IJ SOLN
INTRAOCULAR | Status: DC | PRN
  Administered 2017-03-08: 12:00:00 via OPHTHALMIC

## 2017-03-08 MED ORDER — GLYCOPYRROLATE 0.2 MG/ML IJ SOLN
INTRAMUSCULAR | Status: DC | PRN
  Administered 2017-03-08: 0.1 mg via INTRAVENOUS

## 2017-03-08 MED ORDER — SODIUM CHLORIDE 0.9 % IV SOLN
INTRAVENOUS | Status: DC
  Administered 2017-03-08: 10:00:00 via INTRAVENOUS

## 2017-03-08 MED ORDER — NA CHONDROIT SULF-NA HYALURON 40-17 MG/ML IO SOLN
INTRAOCULAR | Status: DC | PRN
  Administered 2017-03-08: 1 mL via INTRAOCULAR

## 2017-03-08 MED ORDER — LIDOCAINE HCL (PF) 4 % IJ SOLN
INTRAMUSCULAR | Status: AC
  Filled 2017-03-08: qty 5

## 2017-03-08 MED ORDER — FENTANYL CITRATE (PF) 100 MCG/2ML IJ SOLN
INTRAMUSCULAR | Status: AC
  Filled 2017-03-08: qty 2

## 2017-03-08 MED ORDER — NA CHONDROIT SULF-NA HYALURON 40-17 MG/ML IO SOLN
INTRAOCULAR | Status: AC
  Filled 2017-03-08: qty 1

## 2017-03-08 MED ORDER — FENTANYL CITRATE (PF) 100 MCG/2ML IJ SOLN: 25.0000 ug | INTRAMUSCULAR | Status: DC | PRN

## 2017-03-08 MED ORDER — GLYCOPYRROLATE 0.2 MG/ML IJ SOLN
INTRAMUSCULAR | Status: AC
  Filled 2017-03-08: qty 1

## 2017-03-08 SURGICAL SUPPLY — 17 items
GLOVE BIO SURGEON STRL SZ8 (GLOVE) ×2 IMPLANT
GLOVE BIOGEL M 6.5 STRL (GLOVE) ×2 IMPLANT
GLOVE SURG LX 8.0 MICRO (GLOVE) ×1
GLOVE SURG LX STRL 8.0 MICRO (GLOVE) ×1 IMPLANT
GOWN STRL REUS W/ TWL LRG LVL3 (GOWN DISPOSABLE) ×2 IMPLANT
GOWN STRL REUS W/TWL LRG LVL3 (GOWN DISPOSABLE) ×2
LABEL CATARACT MEDS ST (LABEL) ×2 IMPLANT
LENS IOL ACRSF IQ ULTRA 18.5 (Intraocular Lens) ×1 IMPLANT
LENS IOL ACRYSOF IQ 18.5 (Intraocular Lens) ×2 IMPLANT
PACK CATARACT (MISCELLANEOUS) ×2 IMPLANT
PACK CATARACT BRASINGTON LX (MISCELLANEOUS) ×2 IMPLANT
PACK EYE AFTER SURG (MISCELLANEOUS) ×2 IMPLANT
SOL BSS BAG (MISCELLANEOUS) ×2
SOLUTION BSS BAG (MISCELLANEOUS) ×1 IMPLANT
SYR 5ML LL (SYRINGE) ×2 IMPLANT
WATER STERILE IRR 250ML POUR (IV SOLUTION) ×2 IMPLANT
WIPE NON LINTING 3.25X3.25 (MISCELLANEOUS) ×2 IMPLANT

## 2017-03-08 NOTE — Anesthesia Postprocedure Evaluation (Signed)
Anesthesia Post Note  Patient: CHARMAYNE ODELL  Procedure(s) Performed: Procedure(s) (LRB): CATARACT EXTRACTION PHACO AND INTRAOCULAR LENS PLACEMENT (IOC) (Left)  Patient location during evaluation: PACU Anesthesia Type: MAC Level of consciousness: awake and alert Pain management: pain level controlled Vital Signs Assessment: post-procedure vital signs reviewed and stable Respiratory status: spontaneous breathing, nonlabored ventilation, respiratory function stable and patient connected to nasal cannula oxygen Cardiovascular status: stable and blood pressure returned to baseline Anesthetic complications: no     Last Vitals:  Vitals:   03/08/17 1144 03/08/17 1145  BP: (!) 130/47 (!) 130/47  Pulse: (!) 49 (!) 52  Resp: 16 16  Temp: 36.6 C   SpO2: 100% 100%    Last Pain:  Vitals:   03/08/17 1145  TempSrc: Oral  PainSc:                  Darlyne Russian

## 2017-03-08 NOTE — Anesthesia Post-op Follow-up Note (Signed)
Anesthesia QCDR form completed.        

## 2017-03-08 NOTE — H&P (Signed)
All labs reviewed. Abnormal studies sent to patients PCP when indicated.  Previous H&P reviewed, patient examined, there are NO CHANGES.  Graciella Arment LOUIS8/21/201811:17 AM

## 2017-03-08 NOTE — Op Note (Signed)
PREOPERATIVE DIAGNOSIS:  Nuclear sclerotic cataract of the left eye.   POSTOPERATIVE DIAGNOSIS:  Nuclear sclerotic cataract of the left eye.   OPERATIVE PROCEDURE: Procedure(s): CATARACT EXTRACTION PHACO AND INTRAOCULAR LENS PLACEMENT (IOC)   SURGEON:  Birder Robson, MD.   ANESTHESIA:  Anesthesiologist: Alvin Critchley, MD CRNA: Darlyne Russian, CRNA  1.      Managed anesthesia care. 2.     0.67ml of Shugarcaine was instilled following the paracentesis   COMPLICATIONS:  None.   TECHNIQUE:   Stop and chop   DESCRIPTION OF PROCEDURE:  The patient was examined and consented in the preoperative holding area where the aforementioned topical anesthesia was applied to the left eye and then brought back to the Operating Room where the left eye was prepped and draped in the usual sterile ophthalmic fashion and a lid speculum was placed. A paracentesis was created with the side port blade and the anterior chamber was filled with viscoelastic. A near clear corneal incision was performed with the steel keratome. A continuous curvilinear capsulorrhexis was performed with a cystotome followed by the capsulorrhexis forceps. Hydrodissection and hydrodelineation were carried out with BSS on a blunt cannula. The lens was removed in a stop and chop  technique and the remaining cortical material was removed with the irrigation-aspiration handpiece. The capsular bag was inflated with viscoelastic and the Technis [id number] lens was placed in the capsular bag without complication. The remaining viscoelastic was removed from the eye with the irrigation-aspiration handpiece. The wounds were hydrated. The anterior chamber was flushed with Miostat and the eye was inflated to physiologic pressure. 0.15ml Vigamox was placed in the anterior chamber. The wounds were found to be water tight. The eye was dressed with Vigamox. The patient was given protective glasses to wear throughout the day and a shield with which to sleep tonight.  The patient was also given drops with which to begin a drop regimen today and will follow-up with me in one day.  Implant Name Type Inv. Item Serial No. Manufacturer Lot No. LRB No. Used  LENS IOL ACRYSOF IQ 18.5 - [id number] Intraocular Lens LENS IOL ACRYSOF IQ 18.5 [id number] ALCON   Left 1    Procedure(s) with comments: CATARACT EXTRACTION PHACO AND INTRAOCULAR LENS PLACEMENT (IOC) (Left) - [place] 01:16.7 AP% 14.7 CDE 11.27 Fluid Pack Lot # [id number]  Electronically signed: [place] 03/08/2017 11:42 AM

## 2017-03-08 NOTE — Anesthesia Preprocedure Evaluation (Addendum)
Anesthesia Evaluation  Patient identified by MRN, date of birth, ID band Patient awake    Reviewed: Allergy & Precautions, NPO status , Patient's Chart, lab work & pertinent test results  Airway Mallampati: III  TM Distance: <3 FB     Dental  (+) Chipped   Pulmonary neg pulmonary ROS,    Pulmonary exam normal        Cardiovascular hypertension, Pt. on medications +CHF  Normal cardiovascular exam     Neuro/Psych negative neurological ROS  negative psych ROS   GI/Hepatic Neg liver ROS, GERD  ,  Endo/Other  Hypothyroidism   Renal/GU Renal InsufficiencyRenal disease  negative genitourinary   Musculoskeletal  (+) Arthritis , Osteoarthritis,    Abdominal Normal abdominal exam  (+)   Peds negative pediatric ROS (+)  Hematology  (+) anemia ,   Anesthesia Other Findings   Reproductive/Obstetrics                            Anesthesia Physical Anesthesia Plan  ASA: III  Anesthesia Plan: MAC   Post-op Pain Management:    Induction: Intravenous  PONV Risk Score and Plan:   Airway Management Planned: Nasal Cannula  Additional Equipment:   Intra-op Plan:   Post-operative Plan:   Informed Consent: I have reviewed the patients History and Physical, chart, labs and discussed the procedure including the risks, benefits and alternatives for the proposed anesthesia with the patient or authorized representative who has indicated his/her understanding and acceptance.   Dental advisory given  Plan Discussed with: CRNA and Surgeon  Anesthesia Plan Comments:         Anesthesia Quick Evaluation

## 2017-03-08 NOTE — Anesthesia Procedure Notes (Signed)
Procedure Name: MAC Date/Time: 03/08/2017 11:23 AM Performed by: Darlyne Russian Pre-anesthesia Checklist: Patient identified, Emergency Drugs available, Suction available, Patient being monitored and Timeout performed Patient Re-evaluated:Patient Re-evaluated prior to induction Oxygen Delivery Method: Nasal cannula Placement Confirmation: positive ETCO2

## 2017-03-08 NOTE — Transfer of Care (Signed)
Immediate Anesthesia Transfer of Care Note  Patient: Anna Quinn  Procedure(s) Performed: Procedure(s) with comments: CATARACT EXTRACTION PHACO AND INTRAOCULAR LENS PLACEMENT (IOC) (Left) - [place] 01:16.7 AP% 14.7 CDE 11.27 Fluid Pack Lot # [id number]  Patient Location: PACU  Anesthesia Type:MAC  Level of Consciousness: awake, alert  and oriented  Airway & Oxygen Therapy: Patient Spontanous Breathing  Post-op Assessment: Report given to RN and Post -op Vital signs reviewed and stable  Post vital signs: Reviewed and stable  Last Vitals:  Vitals:   03/08/17 1007 03/08/17 1145  BP: 134/64 (!) 130/47  Pulse: (!) 58 (!) 52  Resp: 16 16  Temp: (!) 35.6 C   SpO2: 100% 100%    Last Pain:  Vitals:   03/08/17 1145  TempSrc: Oral         Complications: No apparent anesthesia complications

## 2017-04-20 DIAGNOSIS — Z961 Presence of intraocular lens: Secondary | ICD-10-CM | POA: Diagnosis not present

## 2017-04-27 DIAGNOSIS — Z23 Encounter for immunization: Secondary | ICD-10-CM | POA: Diagnosis not present

## 2017-06-08 DIAGNOSIS — E079 Disorder of thyroid, unspecified: Secondary | ICD-10-CM | POA: Diagnosis not present

## 2017-06-08 DIAGNOSIS — D649 Anemia, unspecified: Secondary | ICD-10-CM | POA: Diagnosis not present

## 2017-06-08 DIAGNOSIS — K219 Gastro-esophageal reflux disease without esophagitis: Secondary | ICD-10-CM | POA: Diagnosis not present

## 2017-06-08 DIAGNOSIS — M1990 Unspecified osteoarthritis, unspecified site: Secondary | ICD-10-CM | POA: Diagnosis not present

## 2017-06-08 DIAGNOSIS — R413 Other amnesia: Secondary | ICD-10-CM | POA: Diagnosis not present

## 2017-06-08 DIAGNOSIS — Z0000 Encounter for general adult medical examination without abnormal findings: Secondary | ICD-10-CM | POA: Diagnosis not present

## 2017-06-08 DIAGNOSIS — I10 Essential (primary) hypertension: Secondary | ICD-10-CM | POA: Diagnosis not present

## 2017-06-15 DIAGNOSIS — K219 Gastro-esophageal reflux disease without esophagitis: Secondary | ICD-10-CM | POA: Diagnosis not present

## 2017-06-15 DIAGNOSIS — M1990 Unspecified osteoarthritis, unspecified site: Secondary | ICD-10-CM | POA: Diagnosis not present

## 2017-06-15 DIAGNOSIS — E079 Disorder of thyroid, unspecified: Secondary | ICD-10-CM | POA: Diagnosis not present

## 2017-06-15 DIAGNOSIS — I10 Essential (primary) hypertension: Secondary | ICD-10-CM | POA: Diagnosis not present

## 2017-06-15 DIAGNOSIS — E782 Mixed hyperlipidemia: Secondary | ICD-10-CM | POA: Diagnosis not present

## 2017-08-04 ENCOUNTER — Other Ambulatory Visit: Payer: Self-pay | Admitting: Internal Medicine

## 2017-08-04 DIAGNOSIS — Z1231 Encounter for screening mammogram for malignant neoplasm of breast: Secondary | ICD-10-CM

## 2017-09-13 ENCOUNTER — Ambulatory Visit
Admission: RE | Admit: 2017-09-13 | Discharge: 2017-09-13 | Disposition: A | Discharge status: Home / Self Care | Payer: Medicare HMO | Source: Ambulatory Visit | Attending: Internal Medicine | Admitting: Internal Medicine

## 2017-09-13 DIAGNOSIS — Z1231 Encounter for screening mammogram for malignant neoplasm of breast: Secondary | ICD-10-CM | POA: Insufficient documentation

## 2017-12-06 DIAGNOSIS — Z0000 Encounter for general adult medical examination without abnormal findings: Secondary | ICD-10-CM | POA: Diagnosis not present

## 2017-12-06 DIAGNOSIS — E079 Disorder of thyroid, unspecified: Secondary | ICD-10-CM | POA: Diagnosis not present

## 2017-12-06 DIAGNOSIS — E782 Mixed hyperlipidemia: Secondary | ICD-10-CM | POA: Diagnosis not present

## 2017-12-06 DIAGNOSIS — I10 Essential (primary) hypertension: Secondary | ICD-10-CM | POA: Diagnosis not present

## 2017-12-13 DIAGNOSIS — M25511 Pain in right shoulder: Secondary | ICD-10-CM | POA: Diagnosis not present

## 2017-12-13 DIAGNOSIS — R413 Other amnesia: Secondary | ICD-10-CM | POA: Diagnosis not present

## 2017-12-13 DIAGNOSIS — M1990 Unspecified osteoarthritis, unspecified site: Secondary | ICD-10-CM | POA: Diagnosis not present

## 2017-12-13 DIAGNOSIS — I10 Essential (primary) hypertension: Secondary | ICD-10-CM | POA: Diagnosis not present

## 2017-12-13 DIAGNOSIS — G8929 Other chronic pain: Secondary | ICD-10-CM | POA: Diagnosis not present

## 2017-12-13 DIAGNOSIS — K219 Gastro-esophageal reflux disease without esophagitis: Secondary | ICD-10-CM | POA: Diagnosis not present

## 2017-12-13 DIAGNOSIS — Z0000 Encounter for general adult medical examination without abnormal findings: Secondary | ICD-10-CM | POA: Diagnosis not present

## 2017-12-13 DIAGNOSIS — F411 Generalized anxiety disorder: Secondary | ICD-10-CM | POA: Diagnosis not present

## 2018-06-27 DIAGNOSIS — I10 Essential (primary) hypertension: Secondary | ICD-10-CM | POA: Diagnosis not present

## 2018-06-27 DIAGNOSIS — M25511 Pain in right shoulder: Secondary | ICD-10-CM | POA: Diagnosis not present

## 2018-06-27 DIAGNOSIS — M1990 Unspecified osteoarthritis, unspecified site: Secondary | ICD-10-CM | POA: Diagnosis not present

## 2018-06-27 DIAGNOSIS — R413 Other amnesia: Secondary | ICD-10-CM | POA: Diagnosis not present

## 2018-06-27 DIAGNOSIS — K219 Gastro-esophageal reflux disease without esophagitis: Secondary | ICD-10-CM | POA: Diagnosis not present

## 2018-06-27 DIAGNOSIS — F411 Generalized anxiety disorder: Secondary | ICD-10-CM | POA: Diagnosis not present

## 2018-06-27 DIAGNOSIS — G8929 Other chronic pain: Secondary | ICD-10-CM | POA: Diagnosis not present

## 2018-07-05 DIAGNOSIS — Z23 Encounter for immunization: Secondary | ICD-10-CM | POA: Diagnosis not present

## 2018-07-05 DIAGNOSIS — F411 Generalized anxiety disorder: Secondary | ICD-10-CM | POA: Diagnosis not present

## 2018-07-05 DIAGNOSIS — Z0000 Encounter for general adult medical examination without abnormal findings: Secondary | ICD-10-CM | POA: Diagnosis not present

## 2018-07-05 DIAGNOSIS — D649 Anemia, unspecified: Secondary | ICD-10-CM | POA: Diagnosis not present

## 2018-07-05 DIAGNOSIS — I10 Essential (primary) hypertension: Secondary | ICD-10-CM | POA: Diagnosis not present

## 2018-07-05 DIAGNOSIS — R413 Other amnesia: Secondary | ICD-10-CM | POA: Diagnosis not present

## 2018-12-28 DIAGNOSIS — I10 Essential (primary) hypertension: Secondary | ICD-10-CM | POA: Diagnosis not present

## 2018-12-28 DIAGNOSIS — Z0000 Encounter for general adult medical examination without abnormal findings: Secondary | ICD-10-CM | POA: Diagnosis not present

## 2018-12-28 DIAGNOSIS — R413 Other amnesia: Secondary | ICD-10-CM | POA: Diagnosis not present

## 2018-12-28 DIAGNOSIS — F411 Generalized anxiety disorder: Secondary | ICD-10-CM | POA: Diagnosis not present

## 2018-12-28 DIAGNOSIS — D649 Anemia, unspecified: Secondary | ICD-10-CM | POA: Diagnosis not present

## 2018-12-28 DIAGNOSIS — R8290 Unspecified abnormal findings in urine: Secondary | ICD-10-CM | POA: Diagnosis not present

## 2019-01-04 DIAGNOSIS — F411 Generalized anxiety disorder: Secondary | ICD-10-CM | POA: Diagnosis not present

## 2019-01-04 DIAGNOSIS — F330 Major depressive disorder, recurrent, mild: Secondary | ICD-10-CM | POA: Diagnosis not present

## 2019-01-04 DIAGNOSIS — F0390 Unspecified dementia without behavioral disturbance: Secondary | ICD-10-CM | POA: Insufficient documentation

## 2019-01-04 DIAGNOSIS — Z0000 Encounter for general adult medical examination without abnormal findings: Secondary | ICD-10-CM | POA: Diagnosis not present

## 2019-01-04 DIAGNOSIS — I10 Essential (primary) hypertension: Secondary | ICD-10-CM | POA: Diagnosis not present

## 2019-01-04 DIAGNOSIS — K219 Gastro-esophageal reflux disease without esophagitis: Secondary | ICD-10-CM | POA: Diagnosis not present

## 2019-05-01 DIAGNOSIS — F411 Generalized anxiety disorder: Secondary | ICD-10-CM | POA: Diagnosis not present

## 2019-05-01 DIAGNOSIS — K219 Gastro-esophageal reflux disease without esophagitis: Secondary | ICD-10-CM | POA: Diagnosis not present

## 2019-05-01 DIAGNOSIS — I10 Essential (primary) hypertension: Secondary | ICD-10-CM | POA: Diagnosis not present

## 2019-05-01 DIAGNOSIS — R8290 Unspecified abnormal findings in urine: Secondary | ICD-10-CM | POA: Diagnosis not present

## 2019-05-10 DIAGNOSIS — M1990 Unspecified osteoarthritis, unspecified site: Secondary | ICD-10-CM | POA: Diagnosis not present

## 2019-05-10 DIAGNOSIS — N390 Urinary tract infection, site not specified: Secondary | ICD-10-CM | POA: Diagnosis not present

## 2019-05-10 DIAGNOSIS — M545 Low back pain: Secondary | ICD-10-CM | POA: Diagnosis not present

## 2019-05-10 DIAGNOSIS — G8929 Other chronic pain: Secondary | ICD-10-CM | POA: Diagnosis not present

## 2019-05-10 DIAGNOSIS — Z79899 Other long term (current) drug therapy: Secondary | ICD-10-CM | POA: Diagnosis not present

## 2019-05-10 DIAGNOSIS — F0390 Unspecified dementia without behavioral disturbance: Secondary | ICD-10-CM | POA: Diagnosis not present

## 2019-05-10 DIAGNOSIS — I10 Essential (primary) hypertension: Secondary | ICD-10-CM | POA: Diagnosis not present

## 2019-05-10 DIAGNOSIS — D649 Anemia, unspecified: Secondary | ICD-10-CM | POA: Diagnosis not present

## 2019-05-10 DIAGNOSIS — Z780 Asymptomatic menopausal state: Secondary | ICD-10-CM | POA: Diagnosis not present

## 2019-05-11 DIAGNOSIS — M810 Age-related osteoporosis without current pathological fracture: Secondary | ICD-10-CM | POA: Diagnosis not present

## 2019-08-12 ENCOUNTER — Ambulatory Visit: Payer: Medicare HMO | Attending: Internal Medicine

## 2019-08-12 DIAGNOSIS — Z23 Encounter for immunization: Secondary | ICD-10-CM

## 2019-08-12 NOTE — Progress Notes (Signed)
   Covid-19 Vaccination Clinic  Name:  Anna Quinn    MRN: [id number] DOB: 27-Dec-1932  08/12/2019  Anna Quinn was observed post Covid-19 immunization for 15 minutes without incidence. She was provided with Vaccine Information Sheet and instruction to access the V-Safe system.   Anna Quinn was instructed to call 911 with any severe reactions post vaccine: Marland Kitchen Difficulty breathing  . Swelling of your face and throat  . A fast heartbeat  . A bad rash all over your body  . Dizziness and weakness    Immunizations Administered    Name Date Dose VIS Date Route   Pfizer COVID-19 Vaccine 08/12/2019 10:39 AM 0.3 mL 06/29/2019 Intramuscular   Manufacturer: [place]   Lot: [id number]   [place]: [id number]

## 2019-08-14 HISTORY — PX: BREAST LUMPECTOMY: SHX2

## 2019-08-16 DIAGNOSIS — L821 Other seborrheic keratosis: Secondary | ICD-10-CM | POA: Diagnosis not present

## 2019-08-16 DIAGNOSIS — D225 Melanocytic nevi of trunk: Secondary | ICD-10-CM | POA: Diagnosis not present

## 2019-08-30 ENCOUNTER — Ambulatory Visit: Payer: Medicare HMO

## 2019-08-30 ENCOUNTER — Ambulatory Visit: Payer: Medicare HMO | Attending: Internal Medicine

## 2019-08-30 DIAGNOSIS — Z23 Encounter for immunization: Secondary | ICD-10-CM | POA: Insufficient documentation

## 2019-08-30 NOTE — Progress Notes (Signed)
   Covid-19 Vaccination Clinic  Name:  Anna Quinn    MRN: [id number] DOB: 02/03/1933  08/30/2019  [name] was observed post Covid-19 immunization for 15 minutes without incidence. She was provided with Vaccine Information Sheet and instruction to access the V-Safe system.   [name] was instructed to call 911 with any severe reactions post vaccine: Marland Kitchen Difficulty breathing  . Swelling of your face and throat  . A fast heartbeat  . A bad rash all over your body  . Dizziness and weakness    Immunizations Administered    Name Date Dose VIS Date Route   Pfizer COVID-19 Vaccine 08/30/2019  8:55 AM 0.3 mL 06/29/2019 Intramuscular   Manufacturer: [place]   Lot: [id number]   [place]: [id number]

## 2019-11-01 DIAGNOSIS — D649 Anemia, unspecified: Secondary | ICD-10-CM | POA: Diagnosis not present

## 2019-11-01 DIAGNOSIS — N390 Urinary tract infection, site not specified: Secondary | ICD-10-CM | POA: Diagnosis not present

## 2019-11-01 DIAGNOSIS — M1990 Unspecified osteoarthritis, unspecified site: Secondary | ICD-10-CM | POA: Diagnosis not present

## 2019-11-01 DIAGNOSIS — I10 Essential (primary) hypertension: Secondary | ICD-10-CM | POA: Diagnosis not present

## 2019-11-01 DIAGNOSIS — Z780 Asymptomatic menopausal state: Secondary | ICD-10-CM | POA: Diagnosis not present

## 2019-11-01 DIAGNOSIS — M545 Low back pain: Secondary | ICD-10-CM | POA: Diagnosis not present

## 2019-11-01 DIAGNOSIS — F0390 Unspecified dementia without behavioral disturbance: Secondary | ICD-10-CM | POA: Diagnosis not present

## 2019-11-01 DIAGNOSIS — G8929 Other chronic pain: Secondary | ICD-10-CM | POA: Diagnosis not present

## 2019-11-08 ENCOUNTER — Other Ambulatory Visit: Payer: Self-pay | Admitting: Internal Medicine

## 2019-11-08 DIAGNOSIS — Z1231 Encounter for screening mammogram for malignant neoplasm of breast: Secondary | ICD-10-CM

## 2019-11-08 DIAGNOSIS — E559 Vitamin D deficiency, unspecified: Secondary | ICD-10-CM | POA: Diagnosis not present

## 2019-11-08 DIAGNOSIS — D649 Anemia, unspecified: Secondary | ICD-10-CM | POA: Diagnosis not present

## 2019-11-08 DIAGNOSIS — M810 Age-related osteoporosis without current pathological fracture: Secondary | ICD-10-CM | POA: Insufficient documentation

## 2019-11-08 DIAGNOSIS — I10 Essential (primary) hypertension: Secondary | ICD-10-CM | POA: Diagnosis not present

## 2019-11-08 DIAGNOSIS — K219 Gastro-esophageal reflux disease without esophagitis: Secondary | ICD-10-CM | POA: Diagnosis not present

## 2019-11-08 DIAGNOSIS — E039 Hypothyroidism, unspecified: Secondary | ICD-10-CM | POA: Diagnosis not present

## 2019-11-08 DIAGNOSIS — Z0000 Encounter for general adult medical examination without abnormal findings: Secondary | ICD-10-CM | POA: Diagnosis not present

## 2019-11-08 DIAGNOSIS — R202 Paresthesia of skin: Secondary | ICD-10-CM | POA: Diagnosis not present

## 2019-11-08 DIAGNOSIS — N289 Disorder of kidney and ureter, unspecified: Secondary | ICD-10-CM | POA: Diagnosis not present

## 2020-02-07 ENCOUNTER — Ambulatory Visit
Admission: EM | Admit: 2020-02-07 | Discharge: 2020-02-07 | Disposition: A | Discharge status: Home / Self Care | Payer: Medicare HMO | Attending: Emergency Medicine | Admitting: Emergency Medicine

## 2020-02-07 ENCOUNTER — Other Ambulatory Visit: Payer: Self-pay

## 2020-02-07 DIAGNOSIS — N390 Urinary tract infection, site not specified: Secondary | ICD-10-CM | POA: Diagnosis not present

## 2020-02-07 LAB — POCT URINALYSIS DIP (MANUAL ENTRY)
Glucose, UA: NEGATIVE mg/dL
Nitrite, UA: NEGATIVE
Protein Ur, POC: 100 mg/dL — AB
Spec Grav, UA: >=1.03 — AB (ref 1.010–1.025)
Urobilinogen, UA: 1 E.U./dL
pH, UA: 5 (ref 5.0–8.0)

## 2020-02-07 MED ORDER — CEPHALEXIN 500 MG PO CAPS: 500.0000 mg | ORAL_CAPSULE | Freq: Two times a day (BID) | ORAL | 0 refills | Status: AC

## 2020-02-07 NOTE — ED Triage Notes (Signed)
Per pt son, pt has been c/o of back pain, leg pain and some confusion, tmax at home 100.3, treated with tylenol. Son reports that these have been symptoms of a UTI in the past. No c/o of urinary symptoms.

## 2020-02-07 NOTE — Discharge Instructions (Addendum)
Take the Keflex as directed.    A urine culture is pending.  We will call you if the results indicate the need to change or discontinue the antibiotic.    Follow up with your primary care provider if your symptoms are not improving.

## 2020-02-07 NOTE — ED Provider Notes (Signed)
Roderic Palau    CSN: [id number] Arrival date & time: 02/07/20  1453      History   Chief Complaint Chief Complaint  Patient presents with  . Urinary Tract Infection    HPI Anna Quinn is a 86 y.o. female.   Accompanied by her son, patient presents with low-grade fever, confusion, back pain, and leg pain x 2 days.  T-max 100.3.  Treated at home with Tylenol.  Son reports this has previously indicated a UTI and would like her urine checked.  Patient denies pain currently.  She denies weakness, dizziness, chest pain, shortness of breath, abdominal pain, back pain, leg pain, or other symptoms.  The history is provided by the patient.    Past Medical History:  Diagnosis Date  . Anemia   . Arthritis   . CHF (congestive heart failure) ([place])   . Chronic kidney disease   . DVT (deep venous thrombosis) ([place])   . GERD (gastroesophageal reflux disease)   . Hypertension   . Hypothyroidism     There are no problems to display for this patient.   Past Surgical History:  Procedure Laterality Date  . ABDOMINAL HYSTERECTOMY    . CATARACT EXTRACTION W/PHACO Left 03/08/2017   Procedure: CATARACT EXTRACTION PHACO AND INTRAOCULAR LENS PLACEMENT (IOC);  Surgeon: Birder Robson, MD;  Location: [facility] ORS;  Service: Ophthalmology;  Laterality: Left;  [place] 01:16.7 AP% 14.7 CDE 11.27 Fluid Pack Lot # [id number]  . CHOLECYSTECTOMY    . JOINT REPLACEMENT     tkr    OB History   No obstetric history on file.      Home Medications    Prior to Admission medications   Medication Sig Start Date End Date Taking? Authorizing Provider  aspirin EC 81 MG tablet Take 81 mg by mouth daily.    [provider]  cephALEXin (KEFLEX) 500 MG capsule Take 1 capsule (500 mg total) by mouth 2 (two) times daily for 5 days. 02/07/20 02/12/20  Sharion Balloon, NP  cholecalciferol (VITAMIN D) 1000 units tablet Take 1,000 Units by mouth daily.    [provider]  Ferrous Sulfate  (FEROSUL PO) Take 325 mg by mouth daily.    [provider]  levothyroxine (SYNTHROID, LEVOTHROID) 75 MCG tablet Take 75 mcg by mouth daily before breakfast.    [provider]  lisinopril (PRINIVIL,ZESTRIL) 40 MG tablet Take 40 mg by mouth daily.    [provider]  omeprazole (PRILOSEC) 20 MG capsule Take 20 mg by mouth daily.    [provider]  Probiotic Product ([place]) CAPS Take 1 capsule by mouth daily.    [provider]  sucralfate (CARAFATE) 1 g tablet Take 1 tablet (1 g total) by mouth 4 (four) times daily. 08/19/15   Nance Pear, MD  vitamin B-12 (CYANOCOBALAMIN) 500 MCG tablet Take 1,000 mcg by mouth daily.    [provider]    Family History Family History  Problem Relation Age of Onset  . Breast cancer Sister 34  . Breast cancer Maternal Aunt   . Breast cancer Other   . Breast cancer Other     Social History Social History   Tobacco Use  . Smoking status: Never Smoker  . Smokeless tobacco: Never Used  Substance Use Topics  . Alcohol use: No  . Drug use: Not on file     Allergies   Patient has no known allergies.   Review of Systems Review of Systems  Constitutional: Positive for fever. Negative for chills.  HENT: Negative for ear pain and sore throat.   Eyes: Negative for pain and visual disturbance.  Respiratory: Negative for cough and shortness of breath.   Cardiovascular: Negative for chest pain and palpitations.  Gastrointestinal: Negative for abdominal pain and vomiting.  Genitourinary: Negative for dysuria and hematuria.  Musculoskeletal: Positive for arthralgias and back pain.  Skin: Negative for color change and rash.  Neurological: Negative for dizziness, seizures, syncope, facial asymmetry, speech difficulty, weakness, numbness and headaches.  All other systems reviewed and are negative.    Physical Exam Triage Vital Signs ED Triage Vitals [02/07/20 1512]  Enc Vitals  Group     BP 106/70     Pulse Rate 77     Resp 16     Temp 98.8 F (37.1 C)     Temp Source Oral     SpO2 98 %     Weight      Height      Head Circumference      Peak Flow      Pain Score 0     Pain Loc      Pain Edu?      Excl. in [place]?    No data found.  Updated Vital Signs BP 106/70   Pulse 77   Temp 98.8 F (37.1 C) (Oral)   Resp 16   Ht 5\' 5"  (1.651 m)   SpO2 98%   BMI 26.46 kg/m   Visual Acuity Right Eye Distance:   Left Eye Distance:   Bilateral Distance:    Right Eye Near:   Left Eye Near:    Bilateral Near:     Physical Exam Vitals and nursing note reviewed.  Constitutional:      General: She is not in acute distress.    Appearance: She is well-developed. She is not ill-appearing.  HENT:     Head: Normocephalic and atraumatic.     Mouth/Throat:     Mouth: Mucous membranes are moist.     Pharynx: Oropharynx is clear.  Eyes:     Conjunctiva/sclera: Conjunctivae normal.  Cardiovascular:     Rate and Rhythm: Normal rate and regular rhythm.     Heart sounds: No murmur heard.   Pulmonary:     Effort: Pulmonary effort is normal. No respiratory distress.     Breath sounds: Normal breath sounds.  Abdominal:     General: Bowel sounds are normal.     Palpations: Abdomen is soft.     Tenderness: There is no abdominal tenderness. There is no right CVA tenderness, left CVA tenderness, guarding or rebound.  Musculoskeletal:     Cervical back: Neck supple.     Right lower leg: No edema.     Left lower leg: No edema.  Skin:    General: Skin is warm and dry.     Findings: No rash.  Neurological:     General: No focal deficit present.     Mental Status: She is alert. Mental status is at baseline.     Gait: Gait normal.  Psychiatric:        Mood and Affect: Mood normal.        Behavior: Behavior normal.      UC Treatments / Results  Labs (all labs ordered are listed, but only abnormal results are displayed) Labs Reviewed  POCT URINALYSIS DIP  (MANUAL ENTRY) - Abnormal; Notable for the following components:      Result Value  Clarity, UA hazy (*)    Bilirubin, UA moderate (*)    Ketones, POC UA trace (5) (*)    Spec Grav, UA >=1.030 (*)    Blood, UA small (*)    Protein Ur, POC =100 (*)    Leukocytes, UA Large (3+) (*)    All other components within normal limits  URINE CULTURE    EKG   Radiology No results found.  Procedures Procedures (including critical care time)  Medications Ordered in UC Medications - No data to display  Initial Impression / Assessment and Plan / UC Course  I have reviewed the triage vital signs and the nursing notes.  Pertinent labs & imaging results that were available during my care of the patient were reviewed by me and considered in my medical decision making (see chart for details).   UTI.  Urine culture pending.  Treating with Keflex.  Discussed with patient and her son that we will call if the urine culture indicates the need to change or discontinue the antibiotic.  Instructed her to follow-up with her PCP if her symptoms are not improving.  Patient and son agree to plan of care.       Final Clinical Impressions(s) / UC Diagnoses   Final diagnoses:  Urinary tract infection without hematuria, site unspecified     Discharge Instructions     Take the Keflex as directed.    A urine culture is pending.  We will call you if the results indicate the need to change or discontinue the antibiotic.    Follow up with your primary care provider if your symptoms are not improving.        ED Prescriptions    Medication Sig Dispense Auth. Provider   cephALEXin (KEFLEX) 500 MG capsule Take 1 capsule (500 mg total) by mouth 2 (two) times daily for 5 days. 10 capsule Sharion Balloon, NP     PDMP not reviewed this encounter.   Sharion Balloon, NP 02/07/20 [phone]

## 2020-02-09 LAB — URINE CULTURE: Culture: >=100000 — AB

## 2020-05-01 DIAGNOSIS — D649 Anemia, unspecified: Secondary | ICD-10-CM | POA: Diagnosis not present

## 2020-05-01 DIAGNOSIS — R8290 Unspecified abnormal findings in urine: Secondary | ICD-10-CM | POA: Diagnosis not present

## 2020-05-01 DIAGNOSIS — F411 Generalized anxiety disorder: Secondary | ICD-10-CM | POA: Diagnosis not present

## 2020-05-01 DIAGNOSIS — R413 Other amnesia: Secondary | ICD-10-CM | POA: Diagnosis not present

## 2020-05-01 DIAGNOSIS — R202 Paresthesia of skin: Secondary | ICD-10-CM | POA: Diagnosis not present

## 2020-05-01 DIAGNOSIS — I10 Essential (primary) hypertension: Secondary | ICD-10-CM | POA: Diagnosis not present

## 2020-05-01 DIAGNOSIS — N289 Disorder of kidney and ureter, unspecified: Secondary | ICD-10-CM | POA: Diagnosis not present

## 2020-05-08 ENCOUNTER — Other Ambulatory Visit: Payer: Self-pay | Admitting: Internal Medicine

## 2020-05-08 DIAGNOSIS — F0390 Unspecified dementia without behavioral disturbance: Secondary | ICD-10-CM

## 2020-05-08 DIAGNOSIS — E039 Hypothyroidism, unspecified: Secondary | ICD-10-CM | POA: Diagnosis not present

## 2020-05-08 DIAGNOSIS — M810 Age-related osteoporosis without current pathological fracture: Secondary | ICD-10-CM | POA: Diagnosis not present

## 2020-05-08 DIAGNOSIS — D649 Anemia, unspecified: Secondary | ICD-10-CM | POA: Diagnosis not present

## 2020-05-08 DIAGNOSIS — K219 Gastro-esophageal reflux disease without esophagitis: Secondary | ICD-10-CM | POA: Diagnosis not present

## 2020-05-08 DIAGNOSIS — I10 Essential (primary) hypertension: Secondary | ICD-10-CM | POA: Diagnosis not present

## 2020-05-08 DIAGNOSIS — Z1231 Encounter for screening mammogram for malignant neoplasm of breast: Secondary | ICD-10-CM | POA: Diagnosis not present

## 2020-05-08 DIAGNOSIS — Z79899 Other long term (current) drug therapy: Secondary | ICD-10-CM | POA: Diagnosis not present

## 2020-05-28 ENCOUNTER — Ambulatory Visit
Admission: RE | Admit: 2020-05-28 | Discharge: 2020-05-28 | Disposition: A | Discharge status: Home / Self Care | Payer: Medicare HMO | Source: Ambulatory Visit | Attending: Internal Medicine | Admitting: Internal Medicine

## 2020-05-28 ENCOUNTER — Other Ambulatory Visit: Payer: Self-pay

## 2020-05-28 DIAGNOSIS — F0390 Unspecified dementia without behavioral disturbance: Secondary | ICD-10-CM | POA: Diagnosis not present

## 2020-05-28 DIAGNOSIS — G319 Degenerative disease of nervous system, unspecified: Secondary | ICD-10-CM | POA: Diagnosis not present

## 2020-05-28 DIAGNOSIS — J322 Chronic ethmoidal sinusitis: Secondary | ICD-10-CM | POA: Diagnosis not present

## 2020-05-28 DIAGNOSIS — I6782 Cerebral ischemia: Secondary | ICD-10-CM | POA: Diagnosis not present

## 2020-06-23 ENCOUNTER — Other Ambulatory Visit: Payer: Self-pay | Admitting: Internal Medicine

## 2020-06-23 DIAGNOSIS — N644 Mastodynia: Secondary | ICD-10-CM

## 2020-07-08 DIAGNOSIS — R6889 Other general symptoms and signs: Secondary | ICD-10-CM | POA: Diagnosis not present

## 2020-07-08 DIAGNOSIS — Z20822 Contact with and (suspected) exposure to covid-19: Secondary | ICD-10-CM | POA: Diagnosis not present

## 2020-07-09 ENCOUNTER — Ambulatory Visit
Admission: RE | Admit: 2020-07-09 | Discharge: 2020-07-09 | Disposition: A | Discharge status: Home / Self Care | Payer: Medicare HMO | Source: Ambulatory Visit | Attending: Internal Medicine | Admitting: Internal Medicine

## 2020-07-09 ENCOUNTER — Other Ambulatory Visit: Payer: Self-pay

## 2020-07-09 DIAGNOSIS — N6321 Unspecified lump in the left breast, upper outer quadrant: Secondary | ICD-10-CM | POA: Diagnosis not present

## 2020-07-09 DIAGNOSIS — N644 Mastodynia: Secondary | ICD-10-CM

## 2020-07-14 ENCOUNTER — Other Ambulatory Visit: Payer: Self-pay | Admitting: Infectious Diseases

## 2020-07-14 DIAGNOSIS — N6320 Unspecified lump in the left breast, unspecified quadrant: Secondary | ICD-10-CM

## 2020-07-14 DIAGNOSIS — R928 Other abnormal and inconclusive findings on diagnostic imaging of breast: Secondary | ICD-10-CM

## 2020-07-17 ENCOUNTER — Ambulatory Visit
Admission: RE | Admit: 2020-07-17 | Discharge: 2020-07-17 | Disposition: A | Discharge status: Home / Self Care | Payer: Medicare HMO | Source: Ambulatory Visit | Attending: Infectious Diseases | Admitting: Infectious Diseases

## 2020-07-17 ENCOUNTER — Other Ambulatory Visit: Payer: Self-pay

## 2020-07-17 DIAGNOSIS — N6320 Unspecified lump in the left breast, unspecified quadrant: Secondary | ICD-10-CM | POA: Diagnosis not present

## 2020-07-17 DIAGNOSIS — R928 Other abnormal and inconclusive findings on diagnostic imaging of breast: Secondary | ICD-10-CM | POA: Diagnosis not present

## 2020-07-17 DIAGNOSIS — N6321 Unspecified lump in the left breast, upper outer quadrant: Secondary | ICD-10-CM | POA: Diagnosis not present

## 2020-07-17 DIAGNOSIS — C50412 Malignant neoplasm of upper-outer quadrant of left female breast: Secondary | ICD-10-CM | POA: Diagnosis not present

## 2020-07-17 HISTORY — PX: BREAST BIOPSY: SHX20

## 2020-07-23 ENCOUNTER — Encounter: Payer: Self-pay | Admitting: *Deleted

## 2020-07-23 DIAGNOSIS — C50912 Malignant neoplasm of unspecified site of left female breast: Secondary | ICD-10-CM

## 2020-07-24 DIAGNOSIS — C50412 Malignant neoplasm of upper-outer quadrant of left female breast: Secondary | ICD-10-CM | POA: Diagnosis not present

## 2020-07-24 LAB — SURGICAL PATHOLOGY

## 2020-07-24 NOTE — Progress Notes (Signed)
Notified by Randa Lynn, RN that patient had been notified of her new diagnosis of breast cancer.  Called patient to establish navigation services.  I have scheduled her to see Dr.Cintron on 07/24/20 @ 2:15 and Dr. Donneta Romberg on 07/29/20 at 2:15.  I am not 100% certain of patients comprehension, her husband is also on the phone assisting her.  She is a very poor historian and answers most of my questions as "I don't remember"  I do not know the patients history on this phone call.  Will give educational material at her med/onc appointment.

## 2020-07-29 ENCOUNTER — Inpatient Hospital Stay: Payer: Medicare HMO | Attending: Internal Medicine | Admitting: Internal Medicine

## 2020-07-29 ENCOUNTER — Encounter: Payer: Self-pay | Admitting: Internal Medicine

## 2020-07-29 ENCOUNTER — Inpatient Hospital Stay: Payer: Medicare HMO

## 2020-07-29 DIAGNOSIS — Z170 Estrogen receptor positive status [ER+]: Secondary | ICD-10-CM | POA: Diagnosis not present

## 2020-07-29 DIAGNOSIS — Z79899 Other long term (current) drug therapy: Secondary | ICD-10-CM | POA: Insufficient documentation

## 2020-07-29 DIAGNOSIS — C50412 Malignant neoplasm of upper-outer quadrant of left female breast: Secondary | ICD-10-CM | POA: Diagnosis not present

## 2020-07-29 DIAGNOSIS — I130 Hypertensive heart and chronic kidney disease with heart failure and stage 1 through stage 4 chronic kidney disease, or unspecified chronic kidney disease: Secondary | ICD-10-CM | POA: Diagnosis not present

## 2020-07-29 DIAGNOSIS — I509 Heart failure, unspecified: Secondary | ICD-10-CM | POA: Insufficient documentation

## 2020-07-29 DIAGNOSIS — E039 Hypothyroidism, unspecified: Secondary | ICD-10-CM | POA: Diagnosis not present

## 2020-07-29 DIAGNOSIS — N189 Chronic kidney disease, unspecified: Secondary | ICD-10-CM

## 2020-07-29 NOTE — Assessment & Plan Note (Addendum)
#  Stage I-breast cancer ER/PR positive HER2 negative.  I reviewed the pathology and stage with the patient and husband in detail.  # I had a long discussion with the patient in general regarding the treatment options of breast cancer including-surgery; adjuvant radiation; role of adjuvant systemic therapy including-chemotherapy antihormone therapy.  Patient will need lumpectomy with sentinel lymph node evaluation; status post evaluation with Dr. Peyton Najjar.  Discussed with the patient husband that the goal of treatment is cure.  #Given patient's tumor characteristics; age-patient unlikely to benefit from any adjuvant systemic chemotherapy.  Given her age and small size of tumor benefit from adjuvant radiation is also debatable.  However patient will benefit from adjuvant systemic aromatase inhibitor therapy.  Thank you Dr.Hande for allowing me to participate in the care of your pleasant patient. Please do not hesitate to contact me with questions or concerns in the interim.  Discussed with Dr. Peyton Najjar.  Also discussed with Webb Silversmith, breast navigator.  Patient follow-up with me 2 to 3 weeks postsurgery to review the next treatment plan.  Patient also be discussed at the tumor conference.  # DISPOSITION: # follow upTBD- Dr.B

## 2020-07-29 NOTE — Progress Notes (Signed)
[facility] [place] NOTE  Patient Care Team: Tracie Harrier, MD as PCP - General (Internal Medicine) Rico Junker, RN as Registered Nurse  CHIEF COMPLAINTS/PURPOSE OF CONSULTATION: Breast cancer  #  Oncology History Overview Note  # DEC 2021- LEFT BREAST CA; ER >90%; PR-1-10%; her 2 NEG; G-1; NO LVI; mammo- 2 o'clock 10 cm from the nipple demonstrating an irregular hypoechoic mass measuring 0.5 x 0.5 x 0.5 cm. [Dr.Cintron]  # SURVIVORSHIP:   # GENETICS:   DIAGNOSIS:   STAGE:         ;  GOALS:  CURRENT/MOST RECENT THERAPY :     Carcinoma of upper-outer quadrant of left breast in female, estrogen receptor positive ([place])  07/29/2020 Initial Diagnosis   Carcinoma of upper-outer quadrant of left breast in female, estrogen receptor positive ([place])   07/30/2020 Cancer Staging   Staging form: Breast, AJCC 8th Edition - Clinical: Stage IA (cT1, cN0, cM0, G1, ER+, PR+, HER2-) - Signed by Cammie Sickle, MD on 07/30/2020      HISTORY OF PRESENTING ILLNESS:  Ramond Dial 87 y.o.  female female with no prior history of breast cancer/or malignancies has been referred to [place] for further evaluation recommendations for new diagnosis of breast cancer.   Patient states she was found to have an abnormal screening mammogram which led to diagnostic mammogram/ultrasound/followed by biopsy-as summarized above.  Patient denies any pain.  Denies any shortness of breath or cough.  Chronic mild joint pains.  Not any worse.  Review of Systems  Constitutional: Negative for chills, diaphoresis, fever, malaise/fatigue and weight loss.  HENT: Negative for nosebleeds and sore throat.   Eyes: Negative for double vision.  Respiratory: Negative for cough, hemoptysis, sputum production, shortness of breath and wheezing.   Cardiovascular: Negative for chest pain, palpitations, orthopnea and leg swelling.  Gastrointestinal: Negative for abdominal pain, blood in stool, constipation,  diarrhea, heartburn, melena, nausea and vomiting.  Genitourinary: Negative for dysuria, frequency and urgency.  Musculoskeletal: Positive for back pain and joint pain.  Skin: Negative.  Negative for itching and rash.  Neurological: Negative for dizziness, tingling, focal weakness, weakness and headaches.  Endo/Heme/Allergies: Does not bruise/bleed easily.  Psychiatric/Behavioral: Negative for depression. The patient is not nervous/anxious and does not have insomnia.      MEDICAL HISTORY:  Past Medical History:  Diagnosis Date  . Anemia   . Arthritis   . CHF (congestive heart failure) ([place])   . Chronic kidney disease   . DVT (deep venous thrombosis) ([place])   . GERD (gastroesophageal reflux disease)   . Hypertension   . Hypothyroidism     SURGICAL HISTORY: Past Surgical History:  Procedure Laterality Date  . ABDOMINAL HYSTERECTOMY    . CATARACT EXTRACTION W/PHACO Left 03/08/2017   Procedure: CATARACT EXTRACTION PHACO AND INTRAOCULAR LENS PLACEMENT (IOC);  Surgeon: Birder Robson, MD;  Location: [facility] ORS;  Service: Ophthalmology;  Laterality: Left;  [place] 01:16.7 AP% 14.7 CDE 11.27 Fluid Pack Lot # [id number]  . CHOLECYSTECTOMY    . JOINT REPLACEMENT     tkr    SOCIAL HISTORY: Social History   Socioeconomic History  . Marital status: Married    Spouse name: Not on file  . Number of children: Not on file  . Years of education: Not on file  . Highest education level: Not on file  Occupational History  . Not on file  Tobacco Use  . Smoking status: Never Smoker  . Smokeless tobacco: Never Used  Substance and  Sexual Activity  . Alcohol use: No  . Drug use: Not on file  . Sexual activity: Not on file  Other Topics Concern  . Not on file  Social History Narrative  . Not on file   Social Determinants of Health   Financial Resource Strain: Not on file  Food Insecurity: Not on file  Transportation Needs: Not on file  Physical Activity: Not on file  Stress: Not on file   Social Connections: Not on file  Intimate Partner Violence: Not on file    FAMILY HISTORY: Family History  Problem Relation Age of Onset  . Breast cancer Sister 2  . Breast cancer Maternal Aunt   . Breast cancer Other   . Breast cancer Other     ALLERGIES:  is allergic to celebrex [celecoxib], rofecoxib, and triamterene-hydrochlorothiazide [hydrochlorothiazide w-triamterene].  MEDICATIONS:  Current Outpatient Medications  Medication Sig Dispense Refill  . alendronate (FOSAMAX) 70 MG tablet TAKE 1 TABLET EVERY 7 (SEVEN) DAYS WITH A FULL GLASS OF WATER. DO NOT LIE DOWN FOR THE NEXT 30 MINUTES    . aspirin EC 81 MG tablet Take 81 mg by mouth daily.    . cholecalciferol (VITAMIN D) 1000 units tablet Take 1,000 Units by mouth daily.    Marland Kitchen donepezil (ARICEPT) 5 MG tablet Take by mouth.    . Ferrous Sulfate (FEROSUL PO) Take 325 mg by mouth daily.    Marland Kitchen gabapentin (NEURONTIN) 100 MG capsule Take by mouth.    . levothyroxine (SYNTHROID, LEVOTHROID) 75 MCG tablet Take 75 mcg by mouth daily before breakfast.    . lisinopril (PRINIVIL,ZESTRIL) 40 MG tablet Take 40 mg by mouth daily.    . memantine (NAMENDA) 5 MG tablet Take by mouth.    Marland Kitchen omeprazole (PRILOSEC) 20 MG capsule Take 20 mg by mouth daily.    Marland Kitchen rOPINIRole (REQUIP) 0.5 MG tablet TAKE 1 TABLET EVERY NIGHT    . vitamin B-12 (CYANOCOBALAMIN) 500 MCG tablet Take 1,000 mcg by mouth daily.     No current facility-administered medications for this visit.      Marland Kitchen  PHYSICAL EXAMINATION: ECOG PERFORMANCE STATUS: 0 - Asymptomatic  Vitals:   07/29/20 1406  BP: (!) 147/49  Pulse: (!) 53  Resp: 16  Temp: 97.9 F (36.6 C)  SpO2: 100%   Filed Weights   07/29/20 1406  Weight: 173 lb (78.5 kg)    Physical Exam HENT:     Head: Normocephalic and atraumatic.     Mouth/Throat:     Mouth: Oropharynx is clear and moist.     Pharynx: No oropharyngeal exudate.  Eyes:     Pupils: Pupils are equal, round, and reactive to light.   Cardiovascular:     Rate and Rhythm: Normal rate and regular rhythm.  Pulmonary:     Effort: No respiratory distress.     Breath sounds: No wheezing.  Abdominal:     General: Bowel sounds are normal. There is no distension.     Palpations: Abdomen is soft. There is no mass.     Tenderness: There is no abdominal tenderness. There is no guarding or rebound.  Musculoskeletal:        General: No tenderness or edema. Normal range of motion.     Cervical back: Normal range of motion and neck supple.  Skin:    General: Skin is warm.  Neurological:     Mental Status: She is alert and oriented to person, place, and time.  Psychiatric:  Mood and Affect: Affect normal.      LABORATORY DATA:  I have reviewed the data as listed Lab Results  Component Value Date   WBC 7.4 08/19/2015   HGB 13.2 08/19/2015   HCT 39.7 08/19/2015   MCV 94.7 08/19/2015   PLT 176 08/19/2015   No results for input(s): NA, K, CL, CO2, GLUCOSE, BUN, CREATININE, CALCIUM, GFRNONAA, GFRAA, PROT, ALBUMIN, AST, ALT, ALKPHOS, BILITOT, BILIDIR, IBILI in the last 8760 hours.  RADIOGRAPHIC STUDIES: I have personally reviewed the radiological images as listed and agreed with the findings in the report. US BREAST LTD UNI LEFT INC AXILLA  Result Date: 07/09/2020 CLINICAL DATA:  87 year old female presenting for annual exam as well as with diffuse intermittent right breast pain. EXAM: DIGITAL DIAGNOSTIC BILATERAL MAMMOGRAM WITH CAD AND TOMO ULTRASOUND LEFT BREAST COMPARISON:  Previous exam(s). ACR Breast Density Category c: The breast tissue is heterogeneously dense, which may obscure small masses. FINDINGS: Mammogram: Right breast: No suspicious mass, distortion, or microcalcifications are identified to suggest presence of malignancy. Left breast: Spot compression and full field mL tomosynthesis views of the left breast were performed in addition to standard views. There is a new small irregular mass in the superior left  breast measuring approximately 0.5 cm, which persists on the additional spot imaging. Mammographic images were processed with CAD. Ultrasound: Targeted ultrasound is performed in the left breast at 2 o'clock 10 cm from the nipple demonstrating an irregular hypoechoic mass measuring 0.5 x 0.5 x 0.5 cm. This corresponds to the mass identified mammographically. Targeted ultrasound of the left axilla demonstrates normal-appearing lymph nodes. IMPRESSION: 1. No mammographic evidence of malignancy in the right breast or other finding to explain the patient's intermittent pain. 2. Suspicious small mass in the left breast at 2 o'clock measuring 0.5 cm. RECOMMENDATION: 1. Ultrasound-guided core needle biopsy of the left breast mass at 2 o'clock. 2.  Clinical follow-up as needed for the right breast pain. I have discussed the findings and recommendations with the patient who agrees to proceed with biopsy. If applicable, a reminder letter will be sent to the patient regarding the next appointment. BI-RADS CATEGORY  4: Suspicious. Electronically Signed   By: Audie Pinto M.D.   On: 07/09/2020 12:12   MM DIAG BREAST TOMO BILATERAL  Result Date: 07/09/2020 CLINICAL DATA:  87 year old female presenting for annual exam as well as with diffuse intermittent right breast pain. EXAM: DIGITAL DIAGNOSTIC BILATERAL MAMMOGRAM WITH CAD AND TOMO ULTRASOUND LEFT BREAST COMPARISON:  Previous exam(s). ACR Breast Density Category c: The breast tissue is heterogeneously dense, which may obscure small masses. FINDINGS: Mammogram: Right breast: No suspicious mass, distortion, or microcalcifications are identified to suggest presence of malignancy. Left breast: Spot compression and full field mL tomosynthesis views of the left breast were performed in addition to standard views. There is a new small irregular mass in the superior left breast measuring approximately 0.5 cm, which persists on the additional spot imaging. Mammographic images  were processed with CAD. Ultrasound: Targeted ultrasound is performed in the left breast at 2 o'clock 10 cm from the nipple demonstrating an irregular hypoechoic mass measuring 0.5 x 0.5 x 0.5 cm. This corresponds to the mass identified mammographically. Targeted ultrasound of the left axilla demonstrates normal-appearing lymph nodes. IMPRESSION: 1. No mammographic evidence of malignancy in the right breast or other finding to explain the patient's intermittent pain. 2. Suspicious small mass in the left breast at 2 o'clock measuring 0.5 cm. RECOMMENDATION: 1. Ultrasound-guided core needle biopsy of  the left breast mass at 2 o'clock. 2.  Clinical follow-up as needed for the right breast pain. I have discussed the findings and recommendations with the patient who agrees to proceed with biopsy. If applicable, a reminder letter will be sent to the patient regarding the next appointment. BI-RADS CATEGORY  4: Suspicious. Electronically Signed   By: Audie Pinto M.D.   On: 07/09/2020 12:12   MM CLIP PLACEMENT LEFT  Result Date: 07/17/2020 CLINICAL DATA:  Evaluate post biopsy marker clip placement following ultrasound-guided core needle biopsy of a small left breast mass. EXAM: DIAGNOSTIC LEFT MAMMOGRAM POST ULTRASOUND BIOPSY COMPARISON:  Previous exam(s). FINDINGS: Mammographic images were obtained following ultrasound guided biopsy of a small, 2 o'clock position, left breast mass. The biopsy marking clip is in expected position at the site of biopsy. IMPRESSION: Appropriate positioning of the vision shaped biopsy marking clip at the site of biopsy in the upper outer left breast along the anterior margin of the residual small mass. Final Assessment: Post Procedure Mammograms for Marker Placement Electronically Signed   By: Lajean Manes M.D.   On: 07/17/2020 13:43   [place] LT BREAST BX W LOC DEV 1ST LESION IMG BX SPEC US GUIDE  Addendum Date: 07/24/2020   ADDENDUM REPORT: 07/24/2020 10:02 ADDENDUM: PATHOLOGY  revealed: A. BREAST, LEFT AT 2:00, 10 CM FROM THE NIPPLE; ULTRASOUND-GUIDED CORE NEEDLE BIOPSY: - INVASIVE MAMMARY CARCINOMA, NO SPECIAL TYPE. Size of invasive carcinoma: At least 6 mm in this sample. Histologic grade of invasive carcinoma: Grade 1. Glandular/tubular differentiation score: 2. Ductal carcinoma in situ: Present, low grade. Lymphovascular invasion: Not identified. Pathology results are CONCORDANT with imaging findings, per Dr. Lajean Manes. Pathology results and recommendations below were discussed with patient by telephone on 07/22/2020. Patient reported biopsy site within normal limits with slight tenderness at the site. Post biopsy care instructions were reviewed, questions were answered and my direct phone number was provided to patient. Patient was instructed to call [facility] if any concerns or questions arise related to the biopsy. Recommend surgical consultation: Request for surgical consultation relayed to Al Pimple RN and Tanya Nones RN at [facility] by Electa Sniff RN on 07/22/2020. Pathology results reported by Electa Sniff RN on 07/24/2020. Electronically Signed   By: Lajean Manes M.D.   On: 07/24/2020 10:02   Result Date: 07/24/2020 CLINICAL DATA:  Patient presents for an ultrasound-guided core needle biopsy of a small left breast mass. EXAM: ULTRASOUND GUIDED LEFT BREAST CORE NEEDLE BIOPSY COMPARISON:  Previous exam(s). PROCEDURE: I met with the patient and we discussed the procedure of ultrasound-guided biopsy, including benefits and alternatives. We discussed the high likelihood of a successful procedure. We discussed the risks of the procedure, including infection, bleeding, tissue injury, clip migration, and inadequate sampling. Informed written consent was given. The usual time-out protocol was performed immediately prior to the procedure. Lesion quadrant: Upper outer quadrant Using sterile technique and 1% Lidocaine as local anesthetic, under direct  ultrasound visualization, a 12 gauge spring-loaded device was used to perform biopsy of the 2 o'clock position, 5 mm mass using an inferior approach. At the conclusion of the procedure vision tissue marker clip was deployed into the biopsy cavity. Follow up 2 view mammogram was performed and dictated separately. IMPRESSION: Ultrasound guided biopsy of a left breast mass. No apparent complications. Electronically Signed: By: Lajean Manes M.D. On: 07/17/2020 13:35    ASSESSMENT & PLAN:   Carcinoma of upper-outer quadrant of left breast in female, estrogen  receptor positive ([place]) #Stage I-breast cancer ER/PR positive HER2 negative.  I reviewed the pathology and stage with the patient and husband in detail.  # I had a long discussion with the patient in general regarding the treatment options of breast cancer including-surgery; adjuvant radiation; role of adjuvant systemic therapy including-chemotherapy antihormone therapy.  Patient will need lumpectomy with sentinel lymph node evaluation; status post evaluation with Dr. Peyton Najjar.  Discussed with the patient husband that the goal of treatment is cure.  #Given patient's tumor characteristics; age-patient unlikely to benefit from any adjuvant systemic chemotherapy.  Given her age and small size of tumor benefit from adjuvant radiation is also debatable.  However patient will benefit from adjuvant systemic aromatase inhibitor therapy.  Thank you Dr.Hande for allowing me to participate in the care of your pleasant patient. Please do not hesitate to contact me with questions or concerns in the interim.  Discussed with Dr. Peyton Najjar.  Also discussed with Webb Silversmith, breast navigator.  Patient follow-up with me 2 to 3 weeks postsurgery to review the next treatment plan.  Patient also be discussed at the tumor conference.  # DISPOSITION: # follow upTBD- Dr.B  All questions were answered. The patient/family knows to call the clinic with any problems, questions or  concerns.    Cammie Sickle, MD 07/30/2020 7:06 AM

## 2020-08-01 ENCOUNTER — Other Ambulatory Visit: Payer: Self-pay | Admitting: General Surgery

## 2020-08-01 DIAGNOSIS — C50412 Malignant neoplasm of upper-outer quadrant of left female breast: Secondary | ICD-10-CM

## 2020-08-01 NOTE — Progress Notes (Signed)
Supported patient, husband Wille Glaser,  at initial Med/Onc consult with Dr. Rogue Bussing.  Given Breast Cancer treatment Handbook;  Husband to let us know when surgery is scheduled for coordination of post-op appointment.

## 2020-08-05 ENCOUNTER — Other Ambulatory Visit: Payer: Self-pay | Admitting: General Surgery

## 2020-08-05 DIAGNOSIS — C50412 Malignant neoplasm of upper-outer quadrant of left female breast: Secondary | ICD-10-CM

## 2020-08-05 NOTE — Progress Notes (Signed)
Patient ID: Anna Quinn, female   DOB: 05-18-1933, 87 y.o.   MRN: [id number] Received phone call from husband stating they had some pre-op questions. Discussed pre-op procedures, RF tag placement, and what to expect on day of surgery with patient and husband .

## 2020-08-06 ENCOUNTER — Other Ambulatory Visit: Payer: Self-pay

## 2020-08-06 ENCOUNTER — Ambulatory Visit
Admission: RE | Admit: 2020-08-06 | Discharge: 2020-08-06 | Disposition: A | Discharge status: Home / Self Care | Payer: Medicare HMO | Source: Ambulatory Visit | Attending: General Surgery | Admitting: General Surgery

## 2020-08-06 DIAGNOSIS — C50412 Malignant neoplasm of upper-outer quadrant of left female breast: Secondary | ICD-10-CM | POA: Insufficient documentation

## 2020-08-07 ENCOUNTER — Encounter
Admission: RE | Admit: 2020-08-07 | Discharge: 2020-08-07 | Disposition: A | Discharge status: Home / Self Care | Payer: Medicare HMO | Source: Ambulatory Visit | Attending: General Surgery | Admitting: General Surgery

## 2020-08-07 ENCOUNTER — Ambulatory Visit: Payer: Self-pay | Admitting: General Surgery

## 2020-08-07 DIAGNOSIS — I110 Hypertensive heart disease with heart failure: Secondary | ICD-10-CM | POA: Insufficient documentation

## 2020-08-07 DIAGNOSIS — Z01818 Encounter for other preprocedural examination: Secondary | ICD-10-CM | POA: Insufficient documentation

## 2020-08-07 DIAGNOSIS — I509 Heart failure, unspecified: Secondary | ICD-10-CM | POA: Insufficient documentation

## 2020-08-07 HISTORY — DX: Malignant (primary) neoplasm, unspecified: C80.1

## 2020-08-07 NOTE — Patient Instructions (Addendum)
Your procedure is scheduled on:08-13-20 WEDNESDAY Report to the Registration Desk on the 1st floor of the [facility]-Then proceed to the Radiology Desk (2nd desk on right)-Arrive at 7:30 am  REMEMBER: Instructions that are not followed completely may result in serious medical risk, up to and including death; or upon the discretion of your surgeon and anesthesiologist your surgery may need to be rescheduled.  Do not eat food after midnight the night before surgery.  No gum chewing, lozengers or hard candies.  You may however, drink CLEAR liquids up to 2 hours before you are scheduled to arrive for your surgery. Do not drink anything within 2 hours of your scheduled arrival time.  Clear liquids include: - water  - apple juice without pulp - gatorade - black coffee or tea (Do NOT add milk or creamers to the coffee or tea) Do NOT drink anything that is not on this list.  TAKE THESE MEDICATIONS THE MORNING OF SURGERY WITH A SIP OF WATER: -SYNTHROID (LEVOTHYROXINE) -NAMENDA (MEMANTINE) -PRILOSEC (OMEPRAZOLE)-take one the night before and one on the morning of surgery - helps to prevent nausea after surgery.)  Follow recommendations from Cardiologist, Pulmonologist or PCP regarding stopping Aspirin, Coumadin, Plavix, Eliquis, Pradaxa, or Pletal-LAST DOSE OF ASPIRIN WAS ON 08-06-20 AS INSTRUCTED BY DR CINTRON-DIAZ'S OFFICE  One week prior to surgery: Stop Anti-inflammatories (NSAIDS) such as Advil, Aleve, Ibuprofen, Motrin, Naproxen, Naprosyn and Aspirin based products such as Excedrin, Goodys Powder, BC Powder-OK TO TAKE TYLENOL IF NEEDED  Stop ANY OVER THE COUNTER supplements until after surgery-STOP MELATONIN NOW-YOU MAY RESUME AFTER SURGERY (However, you may continue taking Vitamin D, Vitamin B12, and Probiotic up until the day before surgery.)  No Alcohol for 24 hours before or after surgery.  No Smoking including e-cigarettes for 24 hours prior to surgery.  No chewable tobacco  products for at least 6 hours prior to surgery.  No nicotine patches on the day of surgery.  Do not use any "recreational" drugs for at least a week prior to your surgery.  Please be advised that the combination of cocaine and anesthesia may have negative outcomes, up to and including death. If you test positive for cocaine, your surgery will be cancelled.  On the morning of surgery brush your teeth with toothpaste and water, you may rinse your mouth with mouthwash if you wish. Do not swallow any toothpaste or mouthwash.  Do not wear jewelry, make-up, hairpins, clips or nail polish.  Do not wear lotions, powders, or perfumes.   Do not shave body from the neck down 48 hours prior to surgery just in case you cut yourself which could leave a site for infection.  Also, freshly shaved skin may become irritated if using the CHG soap.  Contact lenses, hearing aids and dentures may not be worn into surgery.  Do not bring valuables to the hospital. [facility] is not responsible for any missing/lost belongings or valuables.   Use CHG Soap as directed on instruction sheet.  Notify your doctor if there is any change in your medical condition (cold, fever, infection).  Wear comfortable clothing (specific to your surgery type) to the hospital.  Plan for stool softeners for home use; pain medications have a tendency to cause constipation. You can also help prevent constipation by eating foods high in fiber such as fruits and vegetables and drinking plenty of fluids as your diet allows.  After surgery, you can help prevent lung complications by doing breathing exercises.  Take deep breaths and  cough every 1-2 hours. Your doctor may order a device called an Incentive Spirometer to help you take deep breaths. When coughing or sneezing, hold a pillow firmly against your incision with both hands. This is called "splinting." Doing this helps protect your incision. It also decreases belly  discomfort.  If you are being admitted to the hospital overnight, leave your suitcase in the car. After surgery it may be brought to your room.  If you are being discharged the day of surgery, you will not be allowed to drive home. You will need a responsible adult (18 years or older) to drive you home and stay with you that night.   If you are taking public transportation, you will need to have a responsible adult (18 years or older) with you. Please confirm with your physician that it is acceptable to use public transportation.   Please call the [place] [facility] at [phone] if you have any questions about these instructions.  Visitation Policy:  Patients undergoing a surgery or procedure may have one family member or support person with them as long as that person is not COVID-19 positive or experiencing its symptoms.  That person may remain in the waiting area during the procedure.  Inpatient Visitation:    Visiting hours are 7 a.m. to 8 p.m. Patients will be allowed one visitor. The visitor may change daily. The visitor must pass COVID-19 screenings, use hand sanitizer when entering and exiting the patient's room and wear a mask at all times, including in the patient's room. Patients must also wear a mask when staff or their visitor are in the room. Masking is required regardless of vaccination status. Systemwide, no visitors 17 or younger.

## 2020-08-11 ENCOUNTER — Other Ambulatory Visit: Payer: Medicare HMO

## 2020-08-11 ENCOUNTER — Encounter
Admission: RE | Admit: 2020-08-11 | Discharge: 2020-08-11 | Disposition: A | Discharge status: Home / Self Care | Payer: Medicare HMO | Source: Ambulatory Visit | Attending: General Surgery | Admitting: General Surgery

## 2020-08-11 ENCOUNTER — Other Ambulatory Visit: Payer: Self-pay

## 2020-08-11 DIAGNOSIS — Z20822 Contact with and (suspected) exposure to covid-19: Secondary | ICD-10-CM | POA: Diagnosis not present

## 2020-08-11 DIAGNOSIS — I110 Hypertensive heart disease with heart failure: Secondary | ICD-10-CM | POA: Diagnosis not present

## 2020-08-11 DIAGNOSIS — I444 Left anterior fascicular block: Secondary | ICD-10-CM | POA: Diagnosis not present

## 2020-08-11 DIAGNOSIS — I509 Heart failure, unspecified: Secondary | ICD-10-CM | POA: Diagnosis not present

## 2020-08-11 DIAGNOSIS — Z01818 Encounter for other preprocedural examination: Secondary | ICD-10-CM | POA: Diagnosis not present

## 2020-08-11 DIAGNOSIS — R001 Bradycardia, unspecified: Secondary | ICD-10-CM | POA: Diagnosis not present

## 2020-08-12 LAB — SARS CORONAVIRUS 2 (TAT 6-24 HRS): SARS Coronavirus 2: NEGATIVE

## 2020-08-13 ENCOUNTER — Encounter
Admission: RE | Disposition: A | Discharge status: Home / Self Care | Payer: Self-pay | Source: Home / Self Care | Attending: General Surgery

## 2020-08-13 ENCOUNTER — Ambulatory Visit: Payer: Medicare HMO | Admitting: Anesthesiology

## 2020-08-13 ENCOUNTER — Ambulatory Visit
Admission: RE | Admit: 2020-08-13 | Discharge: 2020-08-13 | Disposition: A | Discharge status: Home / Self Care | Payer: Medicare HMO | Source: Ambulatory Visit | Attending: General Surgery | Admitting: General Surgery

## 2020-08-13 ENCOUNTER — Encounter
Admission: RE | Admit: 2020-08-13 | Discharge: 2020-08-13 | Disposition: A | Discharge status: Home / Self Care | Payer: Medicare HMO | Source: Ambulatory Visit | Attending: General Surgery | Admitting: General Surgery

## 2020-08-13 ENCOUNTER — Other Ambulatory Visit: Payer: Self-pay

## 2020-08-13 ENCOUNTER — Encounter: Payer: Self-pay | Admitting: General Surgery

## 2020-08-13 ENCOUNTER — Ambulatory Visit
Admission: RE | Admit: 2020-08-13 | Discharge: 2020-08-13 | Disposition: A | Discharge status: Home / Self Care | Payer: Medicare HMO | Attending: General Surgery | Admitting: General Surgery

## 2020-08-13 DIAGNOSIS — N289 Disorder of kidney and ureter, unspecified: Secondary | ICD-10-CM | POA: Diagnosis not present

## 2020-08-13 DIAGNOSIS — Z79890 Hormone replacement therapy (postmenopausal): Secondary | ICD-10-CM | POA: Diagnosis not present

## 2020-08-13 DIAGNOSIS — Z8249 Family history of ischemic heart disease and other diseases of the circulatory system: Secondary | ICD-10-CM | POA: Diagnosis not present

## 2020-08-13 DIAGNOSIS — Z96653 Presence of artificial knee joint, bilateral: Secondary | ICD-10-CM | POA: Diagnosis not present

## 2020-08-13 DIAGNOSIS — C50412 Malignant neoplasm of upper-outer quadrant of left female breast: Secondary | ICD-10-CM | POA: Diagnosis not present

## 2020-08-13 DIAGNOSIS — Z0189 Encounter for other specified special examinations: Secondary | ICD-10-CM | POA: Diagnosis not present

## 2020-08-13 DIAGNOSIS — Z886 Allergy status to analgesic agent status: Secondary | ICD-10-CM | POA: Diagnosis not present

## 2020-08-13 DIAGNOSIS — Z79899 Other long term (current) drug therapy: Secondary | ICD-10-CM | POA: Insufficient documentation

## 2020-08-13 DIAGNOSIS — Z9849 Cataract extraction status, unspecified eye: Secondary | ICD-10-CM | POA: Insufficient documentation

## 2020-08-13 DIAGNOSIS — Z803 Family history of malignant neoplasm of breast: Secondary | ICD-10-CM | POA: Diagnosis not present

## 2020-08-13 DIAGNOSIS — M1990 Unspecified osteoarthritis, unspecified site: Secondary | ICD-10-CM | POA: Diagnosis not present

## 2020-08-13 DIAGNOSIS — Z7982 Long term (current) use of aspirin: Secondary | ICD-10-CM | POA: Diagnosis not present

## 2020-08-13 DIAGNOSIS — Z800 Family history of malignant neoplasm of digestive organs: Secondary | ICD-10-CM | POA: Insufficient documentation

## 2020-08-13 SURGERY — PARTIAL MASTECTOMY WITH RADIO FREQUENCY LOCALIZER
Anesthesia: General | Site: Breast | Laterality: Left

## 2020-08-13 MED ORDER — EPHEDRINE SULFATE 50 MG/ML IJ SOLN
INTRAMUSCULAR | Status: DC | PRN
  Administered 2020-08-13 (×2): 7.5 mg via INTRAVENOUS

## 2020-08-13 MED ORDER — LACTATED RINGERS IV SOLN: INTRAVENOUS | Status: DC

## 2020-08-13 MED ORDER — OXYCODONE HCL 5 MG PO TABS
5.0000 mg | ORAL_TABLET | Freq: Once | ORAL | Status: DC | PRN
Start: 2020-08-13 — End: 2020-08-13

## 2020-08-13 MED ORDER — EPINEPHRINE PF 1 MG/ML IJ SOLN
INTRAMUSCULAR | Status: AC
  Filled 2020-08-13: qty 1

## 2020-08-13 MED ORDER — FENTANYL CITRATE (PF) 100 MCG/2ML IJ SOLN
INTRAMUSCULAR | Status: DC | PRN
  Administered 2020-08-13 (×2): 50 ug via INTRAVENOUS

## 2020-08-13 MED ORDER — ONDANSETRON HCL 4 MG/2ML IJ SOLN
INTRAMUSCULAR | Status: AC
  Filled 2020-08-13: qty 2

## 2020-08-13 MED ORDER — DEXAMETHASONE SODIUM PHOSPHATE 10 MG/ML IJ SOLN
INTRAMUSCULAR | Status: AC
  Filled 2020-08-13: qty 1

## 2020-08-13 MED ORDER — SEVOFLURANE IN SOLN
RESPIRATORY_TRACT | Status: AC
  Filled 2020-08-13: qty 250

## 2020-08-13 MED ORDER — GLYCOPYRROLATE 0.2 MG/ML IJ SOLN
INTRAMUSCULAR | Status: DC | PRN
  Administered 2020-08-13: .2 mg via INTRAVENOUS

## 2020-08-13 MED ORDER — FENTANYL CITRATE (PF) 100 MCG/2ML IJ SOLN: 25.0000 ug | INTRAMUSCULAR | Status: DC | PRN

## 2020-08-13 MED ORDER — PROPOFOL 10 MG/ML IV BOLUS
INTRAVENOUS | Status: AC
  Filled 2020-08-13: qty 40

## 2020-08-13 MED ORDER — TECHNETIUM TC 99M TILMANOCEPT KIT
1.0000 | PACK | Freq: Once | INTRAVENOUS | Status: AC | PRN
  Administered 2020-08-13: 1.05 via INTRADERMAL

## 2020-08-13 MED ORDER — CHLORHEXIDINE GLUCONATE 0.12 % MT SOLN: 15.0000 mL | Freq: Once | OROMUCOSAL | Status: AC

## 2020-08-13 MED ORDER — CEFAZOLIN SODIUM-DEXTROSE 2-4 GM/100ML-% IV SOLN
INTRAVENOUS | Status: AC
  Filled 2020-08-13: qty 100

## 2020-08-13 MED ORDER — HYDROCODONE-ACETAMINOPHEN 5-325 MG PO TABS: 1.0000 | ORAL_TABLET | ORAL | 0 refills | Status: AC | PRN

## 2020-08-13 MED ORDER — BUPIVACAINE-EPINEPHRINE (PF) 0.5% -1:200000 IJ SOLN
INTRAMUSCULAR | Status: DC | PRN
  Administered 2020-08-13: 30 mL via PERINEURAL

## 2020-08-13 MED ORDER — LIDOCAINE HCL (PF) 2 % IJ SOLN
INTRAMUSCULAR | Status: AC
  Filled 2020-08-13: qty 5

## 2020-08-13 MED ORDER — ORAL CARE MOUTH RINSE: 15.0000 mL | Freq: Once | OROMUCOSAL | Status: AC

## 2020-08-13 MED ORDER — LIDOCAINE HCL (CARDIAC) PF 100 MG/5ML IV SOSY
PREFILLED_SYRINGE | INTRAVENOUS | Status: DC | PRN
  Administered 2020-08-13: 80 mg via INTRAVENOUS

## 2020-08-13 MED ORDER — DEXAMETHASONE SODIUM PHOSPHATE 10 MG/ML IJ SOLN
INTRAMUSCULAR | Status: DC | PRN
  Administered 2020-08-13: 5 mg via INTRAVENOUS

## 2020-08-13 MED ORDER — BUPIVACAINE HCL (PF) 0.5 % IJ SOLN
INTRAMUSCULAR | Status: AC
  Filled 2020-08-13: qty 30

## 2020-08-13 MED ORDER — FENTANYL CITRATE (PF) 100 MCG/2ML IJ SOLN
INTRAMUSCULAR | Status: AC
  Filled 2020-08-13: qty 2

## 2020-08-13 MED ORDER — EPHEDRINE 5 MG/ML INJ
INTRAVENOUS | Status: AC
  Filled 2020-08-13: qty 10

## 2020-08-13 MED ORDER — CHLORHEXIDINE GLUCONATE 0.12 % MT SOLN
OROMUCOSAL | Status: AC
  Administered 2020-08-13: 15 mL via OROMUCOSAL
  Filled 2020-08-13: qty 15

## 2020-08-13 MED ORDER — CEFAZOLIN SODIUM-DEXTROSE 2-4 GM/100ML-% IV SOLN
2.0000 g | INTRAVENOUS | Status: AC
  Administered 2020-08-13: 2 g via INTRAVENOUS

## 2020-08-13 MED ORDER — PROPOFOL 10 MG/ML IV BOLUS
INTRAVENOUS | Status: DC | PRN
  Administered 2020-08-13: 100 mg via INTRAVENOUS

## 2020-08-13 MED ORDER — ACETAMINOPHEN 10 MG/ML IV SOLN
INTRAVENOUS | Status: AC
  Filled 2020-08-13: qty 100

## 2020-08-13 MED ORDER — ACETAMINOPHEN 10 MG/ML IV SOLN
INTRAVENOUS | Status: DC | PRN
  Administered 2020-08-13: 1000 mg via INTRAVENOUS

## 2020-08-13 MED ORDER — OXYCODONE HCL 5 MG/5ML PO SOLN: 5.0000 mg | Freq: Once | ORAL | Status: DC | PRN

## 2020-08-13 SURGICAL SUPPLY — 46 items
ADH SKN CLS APL DERMABOND .7 (GAUZE/BANDAGES/DRESSINGS) ×1
APL PRP STRL LF DISP 70% ISPRP (MISCELLANEOUS) ×1
BLADE SURG 15 STRL LF DISP TIS (BLADE) ×1 IMPLANT
BLADE SURG 15 STRL SS (BLADE) ×2
CANISTER SUCT 1200ML W/VALVE (MISCELLANEOUS) ×2 IMPLANT
CHLORAPREP W/TINT 26 (MISCELLANEOUS) ×2 IMPLANT
CNTNR SPEC 2.5X3XGRAD LEK (MISCELLANEOUS) ×2
CONT SPEC 4OZ STER OR WHT (MISCELLANEOUS) ×2
CONT SPEC 4OZ STRL OR WHT (MISCELLANEOUS) ×2
CONTAINER SPEC 2.5X3XGRAD LEK (MISCELLANEOUS) ×1 IMPLANT
COVER WAND RF STERILE (DRAPES) ×2 IMPLANT
DERMABOND ADVANCED (GAUZE/BANDAGES/DRESSINGS) ×1
DERMABOND ADVANCED .7 DNX12 (GAUZE/BANDAGES/DRESSINGS) ×1 IMPLANT
DEVICE DUBIN SPECIMEN MAMMOGRA (MISCELLANEOUS) ×2 IMPLANT
DRAPE LAPAROTOMY TRNSV 106X77 (MISCELLANEOUS) ×2 IMPLANT
ELECT CAUTERY BLADE TIP 2.5 (TIP) ×2
ELECT REM PT RETURN 9FT ADLT (ELECTROSURGICAL) ×2
ELECTRODE CAUTERY BLDE TIP 2.5 (TIP) ×1 IMPLANT
ELECTRODE REM PT RTRN 9FT ADLT (ELECTROSURGICAL) ×1 IMPLANT
GLOVE SURG ENC MOIS LTX SZ6.5 (GLOVE) ×2 IMPLANT
GLOVE SURG UNDER POLY LF SZ6.5 (GLOVE) ×2 IMPLANT
GOWN STRL REUS W/ TWL LRG LVL3 (GOWN DISPOSABLE) ×3 IMPLANT
GOWN STRL REUS W/TWL LRG LVL3 (GOWN DISPOSABLE) ×6
KIT MARKER MARGIN INK (KITS) IMPLANT
KIT TURNOVER KIT A (KITS) ×2 IMPLANT
LABEL OR SOLS (LABEL) ×2 IMPLANT
MANIFOLD NEPTUNE II (INSTRUMENTS) ×2 IMPLANT
MARGIN MAP 10MM (MISCELLANEOUS) IMPLANT
MARKER MARGIN CORRECT CLIP (MARKER) IMPLANT
NDL HYPO 25X1 1.5 SAFETY (NEEDLE) ×1 IMPLANT
NEEDLE HYPO 25X1 1.5 SAFETY (NEEDLE) ×2 IMPLANT
PACK BASIN MINOR ARMC (MISCELLANEOUS) ×2 IMPLANT
RETRACTOR RING XSMALL (MISCELLANEOUS) IMPLANT
RTRCTR WOUND ALEXIS 13CM XS SH (MISCELLANEOUS)
SET LOCALIZER 20 PROBE US (MISCELLANEOUS) ×2 IMPLANT
SUT ETHILON 3-0 FS-10 30 BLK (SUTURE)
SUT MNCRL 4-0 (SUTURE) ×2
SUT MNCRL 4-0 27XMFL (SUTURE) ×1
SUT SILK 2 0 SH (SUTURE) ×2 IMPLANT
SUT VIC AB 3-0 SH 27 (SUTURE) ×2
SUT VIC AB 3-0 SH 27X BRD (SUTURE) ×1 IMPLANT
SUTURE EHLN 3-0 FS-10 30 BLK (SUTURE) IMPLANT
SUTURE MNCRL 4-0 27XMF (SUTURE) ×1 IMPLANT
SYR 10ML LL (SYRINGE) ×2 IMPLANT
SYR BULB IRRIG 60ML STRL (SYRINGE) ×2 IMPLANT
WATER STERILE IRR 1000ML POUR (IV SOLUTION) ×2 IMPLANT

## 2020-08-13 NOTE — Anesthesia Procedure Notes (Signed)
Procedure Name: LMA Insertion Date/Time: 08/13/2020 9:55 AM Performed by: Chanetta Marshall, CRNA Pre-anesthesia Checklist: Patient identified, Emergency Drugs available, Suction available and Patient being monitored Patient Re-evaluated:Patient Re-evaluated prior to induction Oxygen Delivery Method: Circle system utilized Preoxygenation: Pre-oxygenation with 100% oxygen Induction Type: IV induction Ventilation: Mask ventilation without difficulty LMA: LMA inserted LMA Size: 4.0 Number of attempts: 1 Placement Confirmation: positive ETCO2,  breath sounds checked- equal and bilateral and CO2 detector Tube secured with: Tape Dental Injury: Teeth and Oropharynx as per pre-operative assessment

## 2020-08-13 NOTE — Anesthesia Postprocedure Evaluation (Signed)
Anesthesia Post Note  Patient: Anna Quinn  Procedure(s) Performed: PARTIAL MASTECTOMY WITH RADIO FREQUENCY LOCALIZER and sentinel node (Left Breast)  Patient location during evaluation: PACU Anesthesia Type: General Level of consciousness: awake and alert Pain management: pain level controlled Vital Signs Assessment: post-procedure vital signs reviewed and stable Respiratory status: spontaneous breathing, nonlabored ventilation, respiratory function stable and patient connected to nasal cannula oxygen Cardiovascular status: blood pressure returned to baseline and stable Postop Assessment: no apparent nausea or vomiting Anesthetic complications: no   No complications documented.   Last Vitals:  Vitals:   08/13/20 1206 08/13/20 1217  BP: (!) 133/53 (!) 148/50  Pulse: (!) 56 (!) 52  Resp: 11 14  Temp:  (!) 36.4 C  SpO2: 95% 100%    Last Pain:  Vitals:   08/13/20 1217  TempSrc: Oral  PainSc: 0-No pain                 Precious Haws Piscitello

## 2020-08-13 NOTE — Op Note (Signed)
Preoperative diagnosis: Left breast carcinoma.  Postoperative diagnosis: Left breast carcinoma.   Procedure: Left radiofrequency tag-localized partial mastectomy.                       Left Axillary Sentinel Lymph node biopsy  Anesthesia: GETA  Surgeon: Dr. Windell Moment  Wound Classification: Clean  Indications: Patient is a 87 y.o. female with a nonpalpable left breast mass noted on mammography with core biopsy demonstrating invasive mammary carcinoma requires radiofrequency tag-localized partial mastectomy for treatment with sentinel lymph node biopsy.   Findings: 1. Specimen mammography shows marker and tag on specimen 2. Pathology call refers gross examination of margins was close to anterior-lateral margin 3. No other palpable mass or lymph node identified.   Description of procedure: Preoperative radiofrequency tag localization was performed by radiology. In the nuclear medicine suite, the subareolar region was injected with Tc-99 sulfur colloid. Localization studies were reviewed. The patient was taken to the operating room and placed supine on the operating table, and after general anesthesia the left chest and axilla were prepped and draped in the usual sterile fashion. A time-out was completed verifying correct patient, procedure, site, positioning, and implant(s) and/or special equipment prior to beginning this procedure.  By comparing the localization studies and interrogation with Localizer device, the probable trajectory and location of the mass was visualized. A circumareolar skin incision was planned in such a way as to minimize the amount of dissection to reach the mass.  The skin incision was made. Flaps were raised and the location of the tag was confirmed with Localizer device confirmed. A 2-0 silk figure-of-eight stay suture was placed and used for retraction. Dissection was then taken down circumferentially, taking care to include the entire localizing tag and a wide  margin of grossly normal tissue. The specimen and entire localizing tag were removed. The specimen was oriented and sent to radiology with the localization studies. Confirmation was received that the entire target lesion had been resected. The wound was irrigated. Hemostasis was checked. The wound was closed with interrupted sutures of 3-0 Vicryl and a subcuticular suture of Monocryl 3-0. No attempt was made to close the dead space.   A hand-held gamma probe was used to identify the location of the hottest spot in the axilla. An incision was made around the caudal axillary hairline. Dissection was carried down until subdermal facias was advanced. The probe was placed and again, the point of maximal count was found. Dissection continue until nodule was identified. The probe was placed in contact with the node. The node was excised in its entirety.  No additional hot spots were identified. No clinically abnormal nodes were palpated. The procedure was terminated. Hemostasis was achieved and the wound closed in layers with deep interrupted 3-0 Vicryl and skin was closed with subcuticular suture of Monocryl 3-0.  The patient tolerated the procedure well and was taken to the postanesthesia care unit in stable condition.   Sentinel Node Biopsy Synoptic Operative Report  Operation performed with curative intent:No  Tracer(s) used to identify sentinel nodes in the upfront surgery (non-neoadjuvant) setting (select all that apply):Radioactive Tracer  Tracer(s) used to identify sentinel nodes in the neoadjuvant setting (select all that apply):N/A  All nodes (colored or non-colored) present at the end of a dye-filled lymphatic channel were removed:N/A  All significantly radioactive nodes were removed:Yes  All palpable suspicious nodes were removed:N/A  Biopsy-proven positive nodes marked with clips prior to chemotherapy were identified and removed:N/A  Specimen:  Left Breast mass                      Sentinel Lymph nodes                    Re-excision of antero-lateral margin  Complications: None  Estimated Blood Loss: 20 mL

## 2020-08-13 NOTE — Anesthesia Preprocedure Evaluation (Signed)
Anesthesia Evaluation  Patient identified by MRN, date of birth, ID band Patient awake and Patient confused    Reviewed: Allergy & Precautions, H&P , NPO status , Patient's Chart, lab work & pertinent test results  Airway Mallampati: III  TM Distance: >3 FB Neck ROM: limited    Dental  (+) Chipped   Pulmonary neg pulmonary ROS, neg shortness of breath,    Pulmonary exam normal        Cardiovascular Exercise Tolerance: Good hypertension, (-) angina(-) DOE Normal cardiovascular exam     Neuro/Psych negative neurological ROS  negative psych ROS   GI/Hepatic negative GI ROS, Neg liver ROS, GERD  Medicated and Controlled,  Endo/Other  Hypothyroidism   Renal/GU Renal disease     Musculoskeletal  (+) Arthritis ,   Abdominal   Peds  Hematology negative hematology ROS (+)   Anesthesia Other Findings Past Medical History: No date: Anemia No date: Arthritis No date: Cancer (HCC)     Comment:  breast-left No date: CHF (congestive heart failure) (HCC) No date: Chronic kidney disease 2000: DVT (deep venous thrombosis) (HCC)     Comment:  after knee replacement No date: GERD (gastroesophageal reflux disease) No date: Hypertension No date: Hypothyroidism  Past Surgical History: No date: ABDOMINAL HYSTERECTOMY 07/17/2020: BREAST BIOPSY; Left     Comment:  u/s bx, vision marker, Invasive mammary CA 03/08/2017: CATARACT EXTRACTION W/PHACO; Left     Comment:  Procedure: CATARACT EXTRACTION PHACO AND INTRAOCULAR               LENS PLACEMENT (IOC);  Surgeon: Birder Robson, MD;                Location: [facility] ORS;  Service: Ophthalmology;  Laterality:              Left;  [place] 01:16.7 AP% 14.7 CDE 11.27 Fluid Pack Lot #               [id number] No date: CHOLECYSTECTOMY No date: EYE SURGERY No date: JOINT REPLACEMENT; Bilateral     Comment:  tkr  BMI    Body Mass Index: 28.80 kg/m      Reproductive/Obstetrics negative  OB ROS                             Anesthesia Physical Anesthesia Plan  ASA: III  Anesthesia Plan: General LMA   Post-op Pain Management:    Induction: Intravenous  PONV Risk Score and Plan: Dexamethasone, Ondansetron, Midazolam and Treatment may vary due to age or medical condition  Airway Management Planned: LMA  Additional Equipment:   Intra-op Plan:   Post-operative Plan: Extubation in OR  Informed Consent: I have reviewed the patients History and Physical, chart, labs and discussed the procedure including the risks, benefits and alternatives for the proposed anesthesia with the patient or authorized representative who has indicated his/her understanding and acceptance.     Dental Advisory Given  Plan Discussed with: Anesthesiologist, CRNA and Surgeon  Anesthesia Plan Comments: (Patient and husband consented for risks of anesthesia including but not limited to:  - adverse reactions to medications - damage to eyes, teeth, lips or other oral mucosa - nerve damage due to positioning  - sore throat or hoarseness - Damage to heart, brain, nerves, lungs, other parts of body or loss of life  They voiced understanding.)        Anesthesia Quick Evaluation

## 2020-08-13 NOTE — H&P (Signed)
PATIENT PROFILE: Anna Quinn is a 87 y.o. female who presents to the OR for surgical management of Left breast cancer.  PCP: Azzie Glatter, MD  HISTORY OF PRESENT ILLNESS: Ms. Purifoy reports she was complaining of right breast pain. She was evaluated by PCP who ordered diagnostic mammogram. Diagnostic mammogram and ultrasound showed a 0.5 cm small mass on the left breast. This led to core biopsy that confirmed invasive mammary carcinoma. ER/PR, HER2 are still pending.   Family history of breast cancer: sister and niece Family history of other cancers: father colon cancer Menarche: does not recall Menopause: does not recall Used OCP: None Used estrogen and progesterone therapy: none  History of Radiation to the chest: none Number of pregnancies: 3 Age of first pregnancy: 27 Previous breast biopsy: none  PROBLEM LIST: Problem List Date Reviewed: 05/08/2020  Noted  Osteoporosis 11/08/2019  Dementia without behavioral disturbance (CMS-HCC) 01/04/2019  Anemia 02/09/2017  Memory problem 12/11/2015  GAD (generalized anxiety disorder) 12/11/2015  Hypertension Unknown  Thyroid disease Unknown  Arthritis Unknown  Esophageal reflux Unknown    GENERAL REVIEW OF SYSTEMS:   General ROS: negative for - chills, fatigue, fever, weight gain or weight loss Allergy and Immunology ROS: negative for - hives  Hematological and Lymphatic ROS: negative for - bleeding problems or bruising, negative for palpable nodes Endocrine ROS: negative for - heat or cold intolerance, hair changes Respiratory ROS: negative for - cough, shortness of breath or wheezing Cardiovascular ROS: no chest pain or palpitations GI ROS: negative for nausea, vomiting, abdominal pain, diarrhea, constipation Musculoskeletal ROS: negative for - joint swelling or muscle pain Neurological ROS: negative for - confusion, syncope Dermatological ROS: negative for pruritus and rash Psychiatric: negative for anxiety,  depression, difficulty sleeping. Positive for memory loss  MEDICATIONS: Current Outpatient Medications  Medication Sig Dispense Refill  . alendronate (FOSAMAX) 70 MG tablet TAKE 1 TABLET EVERY 7 (SEVEN) DAYS WITH A FULL GLASS OF WATER. DO NOT LIE DOWN FOR THE NEXT 30 MINUTES 12 tablet 11  . aspirin 81 MG EC tablet Take 81 mg by mouth once daily.  . cholecalciferol (CHOLECALCIFEROL) 1,000 unit tablet Take 1,000 Units by mouth once daily.  . cyanocobalamin (VITAMIN B12) 1000 MCG tablet Take 1,000 mcg by mouth once daily.  Marland Kitchen donepeziL (ARICEPT) 5 MG tablet TAKE 1 TABLET (5 MG TOTAL) BY MOUTH NIGHTLY 90 tablet 1  . ferrous sulfate 325 (65 FE) MG tablet TAKE ONE TABLET BY MOUTH EVERY DAY WITH BREAKFAST 30 tablet 5  . gabapentin (NEURONTIN) 100 MG capsule Take 1 capsule (100 mg total) by mouth nightly for 180 days 90 capsule 1  . Lacto gasseri-B bifid-B longum (PHILLIPS' COLON HEALTH) 1.5 billion cell Cap Take 1 capsule by mouth once daily.  Marland Kitchen levothyroxine (SYNTHROID) 75 MCG tablet TAKE 1 TABLET EVERY DAY FOR ON AN EMPTY STOMACH WITH A GLASS OF WATER AT LEAST 30-60 MINUTES BEFORE BREAKFAST 90 tablet 1  . lisinopriL (ZESTRIL) 40 MG tablet TAKE 1 TABLET EVERY DAY 90 tablet 1  . memantine (NAMENDA) 5 MG tablet Take 1 tablet (5 mg total) by mouth 2 (two) times daily 180 tablet 1  . omeprazole (PRILOSEC) 20 MG DR capsule TAKE 1 CAPSULE EVERY DAY 90 capsule 1  . rOPINIRole (REQUIP) 0.5 MG tablet TAKE 1 TABLET EVERY NIGHT 90 tablet 0   No current facility-administered medications for this visit.   ALLERGIES: Celebrex [celecoxib], Maxzide [triamterene-hydrochlorothiazid], and Vioxx [rofecoxib]  PAST MEDICAL HISTORY: Past Medical History:  Diagnosis Date  .  Anemia  . Arthritis  . Breast cancer (CMS-HCC)  . Calculus of gallbladder with other cholecystitis, without mention of obstruction  . Chronic kidney disease  . Enthesopathy of hip region  . Esophageal reflux  . Hyperlipidemia  . Hypertension   . Spinal stenosis, lumbar region, without neurogenic claudication  . Thyroid disease   PAST SURGICAL HISTORY: Past Surgical History:  Procedure Laterality Date  . BILATERAL TOTAL KNEE REPLACEMENTS  Right 2006 and Left 2008  . CATARACT EXTRACTION  . CHOLECYSTECTOMY 2010  . HYSTERECTOMY 1978  TAH    FAMILY HISTORY: Family History  Problem Relation Age of Onset  . Coronary Artery Disease (Blocked arteries around heart) Mother  . Coronary Artery Disease (Blocked arteries around heart) Father  . Colon cancer Father 56  with evidence of metastatic disease involving prostate and bone  . Breast cancer Sister  . Breast cancer Other  [place]  . Cancer Other  Aunt    SOCIAL HISTORY: Social History   Socioeconomic History  . Marital status: Married  Spouse name: Not on file  . Number of children: Not on file  . Years of education: Not on file  . Highest education level: Not on file  Occupational History  . Not on file  Tobacco Use  . Smoking status: Never Smoker  . Smokeless tobacco: Never Used  Vaping Use  . Vaping Use: Never used  Substance and Sexual Activity  . Alcohol use: No  Alcohol/week: 0.0 standard drinks  . Drug use: Not on file  . Sexual activity: Not on file  Other Topics Concern  . Not on file  Social History Narrative  . Not on file   Social Determinants of Health   Financial Resource Strain: Not on file  Food Insecurity: Not on file  Transportation Needs: Not on file   PHYSICAL EXAM: Vitals:  07/24/20 0857  BP: 146/69  Pulse: 58   Body mass index is 32.05 kg/m. Weight: 76.9 kg (169 lb 9.6 oz)   GENERAL: Alert, active, oriented x3  HEENT: Pupils equal reactive to light. Extraocular movements are intact. Sclera clear. Palpebral conjunctiva normal red color.Pharynx clear.  NECK: Supple with no palpable mass and no adenopathy.  LUNGS: Sound clear with no rales rhonchi or wheezes.  HEART: Regular rhythm S1 and S2 without  murmur.  BREAST: breasts appear normal, no suspicious masses, no skin or nipple changes or axillary nodes.  ABDOMEN: Soft and depressible, nontender with no palpable mass, no hepatomegaly.  EXTREMITIES: Well-developed well-nourished symmetrical with no dependent edema.  NEUROLOGICAL: Awake alert oriented, facial expression symmetrical, moving all extremities.  REVIEW OF DATA: I have reviewed the following data today: Appointment on 07/08/2020  Component Date Value  . ID Now [id number] - [facility]* 07/08/2020 Negative  Appointment on 05/01/2020  Component Date Value  . WBC (White Blood Cell Co* 05/01/2020 5.4  . RBC (Red Blood Cell Coun* 05/01/2020 3.78*  . Hemoglobin 05/01/2020 12.0  . Hematocrit 05/01/2020 38.0  . MCV (Mean Corpuscular Vo* 05/01/2020 100.5*  . MCH (Mean Corpuscular He* 05/01/2020 31.7*  . MCHC (Mean Corpuscular H* 05/01/2020 31.6*  . Platelet Count 05/01/2020 166  . RDW-CV (Red Cell Distrib* 05/01/2020 13.2  . MPV (Mean Platelet Volum* 05/01/2020 11.8  . Neutrophils 05/01/2020 2.49  . Lymphocytes 05/01/2020 1.76  . Monocytes 05/01/2020 0.76  . Eosinophils 05/01/2020 0.31  . Basophils 05/01/2020 0.05  . Neutrophil % 05/01/2020 46.3  . Lymphocyte % 05/01/2020 32.7  . Monocyte % 05/01/2020 14.1*  .  Eosinophil % 05/01/2020 5.8*  . Basophil% 05/01/2020 0.9  . Immature Granulocyte % 05/01/2020 0.2  . Immature Granulocyte Cou* 05/01/2020 0.01  . Glucose 05/01/2020 96  . Sodium 05/01/2020 143  . Potassium 05/01/2020 4.9  . Chloride 05/01/2020 109  . Carbon Dioxide (CO2) 05/01/2020 32.6*  . Urea Nitrogen (BUN) 05/01/2020 25  . Creatinine 05/01/2020 1.3*  . Glomerular Filtration Ra* 05/01/2020 39*  . Calcium 05/01/2020 9.5  . AST 05/01/2020 16  . ALT 05/01/2020 9  . Alk Phos (alkaline Phosp* 05/01/2020 63  . Albumin 05/01/2020 4.0  . Bilirubin, Total 05/01/2020 1.1  . Protein, Total 05/01/2020 6.4  . A/G Ratio 05/01/2020 1.7  . Cholesterol, Total 05/01/2020  184  . Triglyceride 05/01/2020 127  . HDL (High Density Lipopr* 05/01/2020 49.9  . LDL Calculated 05/01/2020 109  . VLDL Cholesterol 05/01/2020 25  . Cholesterol/HDL Ratio 05/01/2020 3.7  . Urine Albumin, Random 05/01/2020 34  . Color 05/01/2020 Yellow  . Clarity 05/01/2020 Cloudy*  . Specific Gravity 05/01/2020 1.025  . pH, Urine 05/01/2020 5.5  . Protein, Urinalysis 05/01/2020 Negative  . Glucose, Urinalysis 05/01/2020 Negative  . Ketones, Urinalysis 05/01/2020 Negative  . Blood, Urinalysis 05/01/2020 Trace*  . Nitrite, Urinalysis 05/01/2020 Positive*  . Leukocyte Esterase, Urin* 05/01/2020 Moderate*  . White Blood Cells, Urina* 05/01/2020 10-50*  . Red Blood Cells, Urinaly* 05/01/2020 0-3  . Bacteria, Urinalysis 05/01/2020 Many*  . Squamous Epithelial Cell* 05/01/2020 Few  . Thyroid Stimulating Horm* 05/01/2020 3.346  . Urine Culture, Routine -* 05/01/2020 Final report*  . Result 1 - [facility] 05/01/2020 Klebsiella pneumoniae*  . Antimicrobial Susceptibi* 05/01/2020 Comment    ASSESSMENT: Ms. Calvi is a 87 y.o. female presenting for partial mastectomy and sentinel lymph node biopsy for left breast invasive mammary carcinoma.   Patient was oriented again about the pathology results. Surgical alternatives were discussed with patient including partial vs total mastectomy. Surgical technique and post operative care was discussed with patient. Risk of surgery was discussed with patient including but not limited to: wound infection, seroma, hematoma, brachial plexopathy, mondor's disease (thrombosis of small veins of breast), chronic wound pain, breast lymphedema, altered sensation to the nipple and cosmesis among others.   ER/PR receptors came back positive. Patient evaluated by medical oncology and recommended upfront surgery for the treatment of left breast cancer.   Malignant neoplasm of upper-outer quadrant of left female breast, unspecified estrogen receptor status (CMS-HCC)  [C50.412]  PLAN: 1. Left breast radiofrequency tag partial mastectomy with sentinel lymph node biopsy (19301, [id number]) 2. Hold aspirin 5 days before surgery 3. Contact us if you have any question or concern.   Patient and her husband verbalized understanding, all questions were answered, and were agreeable with the plan outlined above.   Herbert Pun, MD

## 2020-08-13 NOTE — Transfer of Care (Signed)
Immediate Anesthesia Transfer of Care Note  Patient: Anna Quinn  Procedure(s) Performed: PARTIAL MASTECTOMY WITH RADIO FREQUENCY LOCALIZER and sentinel node (Left Breast)  Patient Location: PACU  Anesthesia Type:General  Level of Consciousness: awake, alert  and oriented  Airway & Oxygen Therapy: Patient Spontanous Breathing and Patient connected to face mask oxygen  Post-op Assessment: Report given to RN and Post -op Vital signs reviewed and stable  Post vital signs: Reviewed and stable  Last Vitals:  Vitals Value Taken Time  BP    Temp    Pulse    Resp    SpO2      Last Pain:  Vitals:   08/13/20 0834  TempSrc: Temporal  PainSc: 0-No pain      Patients Stated Pain Goal: 0 (23/95/32 0233)  Complications: No complications documented.

## 2020-08-13 NOTE — Discharge Instructions (Signed)
°  Diet: Resume home heart healthy regular diet.   Activity: No heavy lifting >20 pounds (children, pets, laundry, garbage) or strenuous activity until follow-up, but light activity and walking are encouraged. Do not drive or drink alcohol if taking narcotic pain medications.  Wound care: May shower with soapy water and pat dry (do not rub incisions), but no baths or submerging incision underwater until follow-up. (no swimming)   Medications: Resume all home medications. For mild to moderate pain: acetaminophen (Tylenol) or ibuprofen (if no kidney disease). Combining Tylenol with alcohol can substantially increase your risk of causing liver disease. Narcotic pain medications, if prescribed, can be used for severe pain, though may cause nausea, constipation, and drowsiness. Do not combine Tylenol and Norco within a 6 hour period as Norco contains Tylenol. If you do not need the narcotic pain medication, you do not need to fill the prescription.  Call office ([phone]) at any time if any questions, worsening pain, fevers/chills, bleeding, drainage from incision site, or other concerns.   AMBULATORY SURGERY  DISCHARGE INSTRUCTIONS   1) The drugs that you were given will stay in your system until tomorrow so for the next 24 hours you should not:  A) Drive an automobile B) Make any legal decisions C) Drink any alcoholic beverage   2) You may resume regular meals tomorrow.  Today it is better to start with liquids and gradually work up to solid foods.  You may eat anything you prefer, but it is better to start with liquids, then soup and crackers, and gradually work up to solid foods.   3) Please notify your doctor immediately if you have any unusual bleeding, trouble breathing, redness and pain at the surgery site, drainage, fever, or pain not relieved by medication.  4) Your post-operative visit with Dr.                                     is: Date:                        Time:     Please call to schedule your post-operative visit.  5) Additional Instructions:   

## 2020-08-14 ENCOUNTER — Other Ambulatory Visit: Payer: Self-pay | Admitting: Pathology

## 2020-08-14 LAB — SURGICAL PATHOLOGY

## 2020-08-14 NOTE — Progress Notes (Signed)
Patient is doing well post-op.  Denies any discomfort.

## 2020-09-03 ENCOUNTER — Encounter: Payer: Self-pay | Admitting: Internal Medicine

## 2020-09-03 ENCOUNTER — Inpatient Hospital Stay: Payer: Medicare HMO | Attending: Internal Medicine | Admitting: Internal Medicine

## 2020-09-03 DIAGNOSIS — C50412 Malignant neoplasm of upper-outer quadrant of left female breast: Secondary | ICD-10-CM | POA: Insufficient documentation

## 2020-09-03 DIAGNOSIS — F0390 Unspecified dementia without behavioral disturbance: Secondary | ICD-10-CM | POA: Insufficient documentation

## 2020-09-03 DIAGNOSIS — Z79899 Other long term (current) drug therapy: Secondary | ICD-10-CM | POA: Insufficient documentation

## 2020-09-03 DIAGNOSIS — Z170 Estrogen receptor positive status [ER+]: Secondary | ICD-10-CM | POA: Insufficient documentation

## 2020-09-03 DIAGNOSIS — M810 Age-related osteoporosis without current pathological fracture: Secondary | ICD-10-CM | POA: Diagnosis not present

## 2020-09-03 MED ORDER — ANASTROZOLE 1 MG PO TABS: 1.0000 mg | ORAL_TABLET | Freq: Every day | ORAL | 3 refills | Status: DC

## 2020-09-03 NOTE — Progress Notes (Signed)
[facility] NOTE  Patient Care Team: Tracie Harrier, MD as PCP - General (Internal Medicine) Rico Junker, RN as Registered Nurse  CHIEF COMPLAINTS/PURPOSE OF CONSULTATION: Breast cancer  #  Oncology History Overview Note  # DEC 2021- LEFT BREAST CA; ER >90%; PR-1-10%; her 2 NEG; G-1; NO LVI; mammo- 2 o'clock 10 cm from the nipple demonstrating an irregular hypoechoic mass measuring 0.5 x 0.5 x 0.5 cm. [Dr.Cintron]; pT1 pN0- Stage IA s/p lumpectomy; NO RT [age/dementia]  # FEB 16th, 2022- Anastrazole  # SURVIVORSHIP:   # GENETICS:   DIAGNOSIS:   STAGE:         ;  GOALS:  CURRENT/MOST RECENT THERAPY :     Carcinoma of upper-outer quadrant of left breast in female, estrogen receptor positive ([place])  07/29/2020 Initial Diagnosis   Carcinoma of upper-outer quadrant of left breast in female, estrogen receptor positive ([place])   07/30/2020 Cancer Staging   Staging form: Breast, AJCC 8th Edition - Clinical: Stage IA (cT1, cN0, cM0, G1, ER+, PR+, HER2-) - Signed by Cammie Sickle, MD on 07/30/2020   09/03/2020 Cancer Staging   Staging form: Breast, AJCC 8th Edition - Pathologic stage from 09/03/2020: No Stage Recommended (ypT1c, pN0, cM0, G2, ER+, PR+, HER2-) - Signed by Cammie Sickle, MD on 09/03/2020 Stage prefix: Post-therapy Multigene prognostic tests performed: None Histologic grading system: 3 grade system      HISTORY OF PRESENTING ILLNESS:  Anna Quinn 87 y.o.  female history of breast cancer is here s/p lumpectomy for follow-up review results of her pathology.  Patient denies any pain at the surgery site.  No history of mild redness.  Otherwise healing well.  She has been recently been evaluated by surgery.  No new shortness of breath or cough.  Chronic joint pains.   Review of Systems  Constitutional: Negative for chills, diaphoresis, fever, malaise/fatigue and weight loss.  HENT: Negative for nosebleeds and sore throat.    Eyes: Negative for double vision.  Respiratory: Negative for cough, hemoptysis, sputum production, shortness of breath and wheezing.   Cardiovascular: Negative for chest pain, palpitations, orthopnea and leg swelling.  Gastrointestinal: Negative for abdominal pain, blood in stool, constipation, diarrhea, heartburn, melena, nausea and vomiting.  Genitourinary: Negative for dysuria, frequency and urgency.  Musculoskeletal: Positive for back pain and joint pain.  Skin: Negative.  Negative for itching and rash.  Neurological: Negative for dizziness, tingling, focal weakness, weakness and headaches.  Endo/Heme/Allergies: Does not bruise/bleed easily.  Psychiatric/Behavioral: Positive for memory loss. Negative for depression. The patient is not nervous/anxious and does not have insomnia.      MEDICAL HISTORY:  Past Medical History:  Diagnosis Date  . Anemia   . Arthritis   . Cancer ([place])    breast-left  . CHF (congestive heart failure) ([place])   . Chronic kidney disease   . DVT (deep venous thrombosis) ([place]) 2000   after knee replacement  . GERD (gastroesophageal reflux disease)   . Hypertension   . Hypothyroidism     SURGICAL HISTORY: Past Surgical History:  Procedure Laterality Date  . ABDOMINAL HYSTERECTOMY    . BREAST BIOPSY Left 07/17/2020   u/s bx, vision marker, Invasive mammary CA  . CATARACT EXTRACTION W/PHACO Left 03/08/2017   Procedure: CATARACT EXTRACTION PHACO AND INTRAOCULAR LENS PLACEMENT (IOC);  Surgeon: Birder Robson, MD;  Location: [facility] ORS;  Service: Ophthalmology;  Laterality: Left;  [place] 01:16.7 AP% 14.7 CDE 11.27 Fluid Pack Lot # [id number]  .  CHOLECYSTECTOMY    . EYE SURGERY    . JOINT REPLACEMENT Bilateral    tkr    SOCIAL HISTORY: Social History   Socioeconomic History  . Marital status: Married    Spouse name: Not on file  . Number of children: Not on file  . Years of education: Not on file  . Highest education level: Not on file  Occupational  History  . Not on file  Tobacco Use  . Smoking status: Never Smoker  . Smokeless tobacco: Never Used  Vaping Use  . Vaping Use: Never used  Substance and Sexual Activity  . Alcohol use: No  . Drug use: Never  . Sexual activity: Not on file  Other Topics Concern  . Not on file  Social History Narrative  . Not on file   Social Determinants of Health   Financial Resource Strain: Not on file  Food Insecurity: Not on file  Transportation Needs: Not on file  Physical Activity: Not on file  Stress: Not on file  Social Connections: Not on file  Intimate Partner Violence: Not on file    FAMILY HISTORY: Family History  Problem Relation Age of Onset  . Breast cancer Sister 75  . Breast cancer Maternal Aunt   . Breast cancer Other   . Breast cancer Other     ALLERGIES:  is allergic to celebrex [celecoxib], rofecoxib, and triamterene-hydrochlorothiazide [hydrochlorothiazide w-triamterene].  MEDICATIONS:  Current Outpatient Medications  Medication Sig Dispense Refill  . anastrozole (ARIMIDEX) 1 MG tablet Take 1 tablet (1 mg total) by mouth daily. 90 tablet 3  . acetaminophen (TYLENOL) 500 MG tablet Take 500 mg by mouth every 6 (six) hours as needed for moderate pain.    Marland Kitchen alendronate (FOSAMAX) 70 MG tablet Take 70 mg by mouth every Monday.    Marland Kitchen aspirin EC 81 MG tablet Take 81 mg by mouth at bedtime.    . cholecalciferol (VITAMIN D) 1000 units tablet Take 1,000 Units by mouth daily.    Marland Kitchen donepezil (ARICEPT) 5 MG tablet Take 5 mg by mouth at bedtime.    . ferrous sulfate 325 (65 FE) MG tablet Take 325 mg by mouth daily with breakfast.    . gabapentin (NEURONTIN) 100 MG capsule Take 100 mg by mouth at bedtime.    Marland Kitchen levothyroxine (SYNTHROID, LEVOTHROID) 75 MCG tablet Take 75 mcg by mouth daily before breakfast.    . lisinopril (PRINIVIL,ZESTRIL) 40 MG tablet Take 40 mg by mouth every morning.    . Melatonin 3 MG CAPS Take 3 mg by mouth at bedtime.    . memantine (NAMENDA) 5 MG  tablet Take 5 mg by mouth 2 (two) times daily.    Marland Kitchen omeprazole (PRILOSEC) 20 MG capsule Take 20 mg by mouth every morning.    . Probiotic Product ([place]) CAPS Take 1 capsule by mouth daily.    Marland Kitchen rOPINIRole (REQUIP) 0.5 MG tablet Take 0.5 mg by mouth at bedtime.    . vitamin B-12 (CYANOCOBALAMIN) 1000 MCG tablet Take 1,000 mcg by mouth daily.     No current facility-administered medications for this visit.      Marland Kitchen  PHYSICAL EXAMINATION: ECOG PERFORMANCE STATUS: 0 - Asymptomatic  Vitals:   09/03/20 1436  BP: (!) 152/64  Pulse: (!) 53  Resp: 16  Temp: 97.7 F (36.5 C)  SpO2: 100%   Filed Weights   09/03/20 1436  Weight: 178 lb (80.7 kg)    Physical Exam HENT:  Head: Normocephalic and atraumatic.     Mouth/Throat:     Pharynx: No oropharyngeal exudate.  Eyes:     Pupils: Pupils are equal, round, and reactive to light.  Cardiovascular:     Rate and Rhythm: Normal rate and regular rhythm.  Pulmonary:     Effort: No respiratory distress.     Breath sounds: No wheezing.  Abdominal:     General: Bowel sounds are normal. There is no distension.     Palpations: Abdomen is soft. There is no mass.     Tenderness: There is no abdominal tenderness. There is no guarding or rebound.  Musculoskeletal:        General: No tenderness. Normal range of motion.     Cervical back: Normal range of motion and neck supple.  Skin:    General: Skin is warm.     Comments: Mild erythema noted 12 o'clock position-no concerns for any infection.  Incision well healing.  Neurological:     Mental Status: She is alert and oriented to person, place, and time.  Psychiatric:        Mood and Affect: Affect normal.      LABORATORY DATA:  I have reviewed the data as listed Lab Results  Component Value Date   WBC 7.4 08/19/2015   HGB 13.2 08/19/2015   HCT 39.7 08/19/2015   MCV 94.7 08/19/2015   PLT 176 08/19/2015   No results for input(s): NA, K, CL, CO2, GLUCOSE, BUN,  CREATININE, CALCIUM, GFRNONAA, GFRAA, PROT, ALBUMIN, AST, ALT, ALKPHOS, BILITOT, BILIDIR, IBILI in the last 8760 hours.  RADIOGRAPHIC STUDIES: I have personally reviewed the radiological images as listed and agreed with the findings in the report. NM Sentinel Node Inj-No Rpt (Breast)  Result Date: 08/13/2020 Sulfur colloid was injected by the nuclear medicine technologist for melanoma sentinel node.   MM Breast Surgical Specimen  Result Date: 08/13/2020 CLINICAL DATA:  Post lumpectomy specimen radiograph. EXAM: SPECIMEN RADIOGRAPH OF THE LEFT BREAST COMPARISON:  Previous exam(s). FINDINGS: Status post excision of the left breast. The reflector and vision biopsy marker clip are present, completely intact, and were marked for pathology. IMPRESSION: Specimen radiograph of the left breast. Electronically Signed   By: Audie Pinto M.D.   On: 08/13/2020 12:19   MM LT RADIO FREQUENCY TAG LOC MAMMO GUIDE  Result Date: 08/06/2020 CLINICAL DATA:  Biopsy-proven invasive mammary carcinoma of the upper-outer LEFT breast scheduled for breast conservation surgery requiring preoperative SAVI localization. EXAM: NEEDLE LOCALIZATION OF THE LEFT BREAST WITH MAMMO GUIDANCE COMPARISON:  Previous exams. FINDINGS: Patient presents for needle localization prior to breast conservation surgery. I met with the patient and we discussed the procedure of needle localization including benefits and alternatives. We discussed the high likelihood of a successful procedure. We discussed the risks of the procedure, including infection, bleeding, tissue injury, and further surgery. Informed, written consent was given. The usual time-out protocol was performed immediately prior to the procedure. Using mammographic guidance, sterile technique, 1% lidocaine and a 7 cm SAVI SCOUT needle, the vision tissue marker clip within the upper-outer quadrant of the LEFT breast was localized using a superior approach. Reflector function was  confirmed with an auditory signal from the SAVI SCOUT guide. The images were marked for Cintron-Diaz. IMPRESSION: Radar reflector localization of the left breast, positioned approximately 5 mm superior to the Vision tissue marker. No apparent complications. Electronically Signed   By: Franki Cabot M.D.   On: 08/06/2020 15:58    ASSESSMENT & PLAN:  Carcinoma of upper-outer quadrant of left breast in female, estrogen receptor positive ([place]) #Stage I-breast cancer ER/PR positive HER2 negative.  Stage IA.  No role for any adjuvant chemotherapy.  I discussed the role of adjuvant radiation-when breast conserving surgery.  However, given her age/dementia-I think radiation could be omitted.  Discussed that radiation has shown to decrease local recurrence; and would not affect her overall survival.  Discussed at the tumor conference.  #Discussed the role of aromatase inhibitor. Discussed the mechanism of action of aromatase inhibitors-with blocking of estrogen to prevent breast cancer.  Also discussed the potential side effects including but not limited to arthralgias hot flashes and increased risk of osteoporosis.  Prescription for anastrozole given.  # 2020- Osteoporosis.Interval decrease from 07-31-2008- on Fosomax [PCP] ; ca+vit D.   # dementia- mild- moderate-continue Aricept/Namenda.  # DISPOSITION: # follow up in 2 months MD; no labs-- Dr.B  All questions were answered. The patient/family knows to call the clinic with any problems, questions or concerns.    Cammie Sickle, MD 09/03/2020 3:44 PM

## 2020-09-03 NOTE — Assessment & Plan Note (Addendum)
#  Stage I-breast cancer ER/PR positive HER2 negative.  Stage IA.  No role for any adjuvant chemotherapy.  I discussed the role of adjuvant radiation-when breast conserving surgery.  However, given her age/dementia-I think radiation could be omitted.  Discussed that radiation has shown to decrease local recurrence; and would not affect her overall survival.  Discussed at the tumor conference.  #Discussed the role of aromatase inhibitor. Discussed the mechanism of action of aromatase inhibitors-with blocking of estrogen to prevent breast cancer.  Also discussed the potential side effects including but not limited to arthralgias hot flashes and increased risk of osteoporosis.  Prescription for anastrozole given.  # 2020- Osteoporosis.Interval decrease from 07-31-2008- on Fosomax [PCP] ; ca+vit D.   # dementia- mild- moderate-continue Aricept/Namenda.  # DISPOSITION: # follow up in 2 months MD; no labs-- Dr.B

## 2020-09-18 ENCOUNTER — Encounter: Payer: Self-pay | Admitting: *Deleted

## 2020-10-29 ENCOUNTER — Inpatient Hospital Stay: Payer: Medicare HMO | Attending: Internal Medicine | Admitting: Internal Medicine

## 2020-10-29 ENCOUNTER — Other Ambulatory Visit: Payer: Self-pay

## 2020-10-29 ENCOUNTER — Encounter: Payer: Self-pay | Admitting: Internal Medicine

## 2020-10-29 DIAGNOSIS — C50412 Malignant neoplasm of upper-outer quadrant of left female breast: Secondary | ICD-10-CM

## 2020-10-29 DIAGNOSIS — Z79899 Other long term (current) drug therapy: Secondary | ICD-10-CM | POA: Insufficient documentation

## 2020-10-29 DIAGNOSIS — Z170 Estrogen receptor positive status [ER+]: Secondary | ICD-10-CM | POA: Insufficient documentation

## 2020-10-29 DIAGNOSIS — F0390 Unspecified dementia without behavioral disturbance: Secondary | ICD-10-CM | POA: Insufficient documentation

## 2020-10-29 NOTE — Progress Notes (Signed)
Pt and niece in for follow up, pt having increased memory difficulties.  Pt worried about husband today who had to be taken to hospital.

## 2020-10-29 NOTE — Progress Notes (Signed)
[facility] [place] NOTE  Patient Care Team: Tracie Harrier, MD as PCP - General (Internal Medicine) Rico Junker, RN as Registered Nurse  CHIEF COMPLAINTS/PURPOSE OF CONSULTATION: Breast cancer  #  Oncology History Overview Note  # DEC 2021- LEFT BREAST CA; ER >90%; PR-1-10%; her 2 NEG; G-1; NO LVI; mammo- 2 o'clock 10 cm from the nipple demonstrating an irregular hypoechoic mass measuring 0.5 x 0.5 x 0.5 cm. [Dr.Cintron]; pT1 pN0- Stage IA s/p lumpectomy; NO RT [age/dementia]  # FEB 16th, 2022- Anastrazole  # SURVIVORSHIP:   # GENETICS:   DIAGNOSIS:   STAGE:         ;  GOALS:  CURRENT/MOST RECENT THERAPY :     Carcinoma of upper-outer quadrant of left breast in female, estrogen receptor positive ([place])  07/29/2020 Initial Diagnosis   Carcinoma of upper-outer quadrant of left breast in female, estrogen receptor positive ([place])   07/30/2020 Cancer Staging   Staging form: Breast, AJCC 8th Edition - Clinical: Stage IA (cT1, cN0, cM0, G1, ER+, PR+, HER2-) - Signed by Cammie Sickle, MD on 07/30/2020   09/03/2020 Cancer Staging   Staging form: Breast, AJCC 8th Edition - Pathologic stage from 09/03/2020: No Stage Recommended (ypT1c, pN0, cM0, G2, ER+, PR+, HER2-) - Signed by Cammie Sickle, MD on 09/03/2020 Stage prefix: Post-therapy Multigene prognostic tests performed: None Histologic grading system: 3 grade system      HISTORY OF PRESENTING ILLNESS:  Anna Quinn 87 y.o.  female patient with ER/PR positive stage I breast cancer currently on anastrozole is here for follow-up.  Patient denies any new lumps or bumps.  Denies any worsening pain.  No shortness of breath or cough.  As per family patient continues to struggle with dementia/memory issues.  Review of Systems  Constitutional: Negative for chills, diaphoresis, fever, malaise/fatigue and weight loss.  HENT: Negative for nosebleeds and sore throat.   Eyes: Negative for double  vision.  Respiratory: Negative for cough, hemoptysis, sputum production, shortness of breath and wheezing.   Cardiovascular: Negative for chest pain, palpitations, orthopnea and leg swelling.  Gastrointestinal: Negative for abdominal pain, blood in stool, constipation, diarrhea, heartburn, melena, nausea and vomiting.  Genitourinary: Negative for dysuria, frequency and urgency.  Musculoskeletal: Positive for back pain and joint pain.  Skin: Negative.  Negative for itching and rash.  Neurological: Negative for dizziness, tingling, focal weakness, weakness and headaches.  Endo/Heme/Allergies: Does not bruise/bleed easily.  Psychiatric/Behavioral: Positive for memory loss. Negative for depression. The patient is not nervous/anxious and does not have insomnia.      MEDICAL HISTORY:  Past Medical History:  Diagnosis Date  . Anemia   . Arthritis   . Cancer ([place])    breast-left  . CHF (congestive heart failure) ([place])   . Chronic kidney disease   . DVT (deep venous thrombosis) ([place]) 2000   after knee replacement  . GERD (gastroesophageal reflux disease)   . Hypertension   . Hypothyroidism     SURGICAL HISTORY: Past Surgical History:  Procedure Laterality Date  . ABDOMINAL HYSTERECTOMY    . BREAST BIOPSY Left 07/17/2020   u/s bx, vision marker, Invasive mammary CA  . CATARACT EXTRACTION W/PHACO Left 03/08/2017   Procedure: CATARACT EXTRACTION PHACO AND INTRAOCULAR LENS PLACEMENT (IOC);  Surgeon: Birder Robson, MD;  Location: [facility] ORS;  Service: Ophthalmology;  Laterality: Left;  [place] 01:16.7 AP% 14.7 CDE 11.27 Fluid Pack Lot # [id number]  . CHOLECYSTECTOMY    . EYE SURGERY    .  JOINT REPLACEMENT Bilateral    tkr    SOCIAL HISTORY: Social History   Socioeconomic History  . Marital status: Married    Spouse name: Not on file  . Number of children: Not on file  . Years of education: Not on file  . Highest education level: Not on file  Occupational History  . Not on file   Tobacco Use  . Smoking status: Never Smoker  . Smokeless tobacco: Never Used  Vaping Use  . Vaping Use: Never used  Substance and Sexual Activity  . Alcohol use: No  . Drug use: Never  . Sexual activity: Not on file  Other Topics Concern  . Not on file  Social History Narrative  . Not on file   Social Determinants of Health   Financial Resource Strain: Not on file  Food Insecurity: Not on file  Transportation Needs: Not on file  Physical Activity: Not on file  Stress: Not on file  Social Connections: Not on file  Intimate Partner Violence: Not on file    FAMILY HISTORY: Family History  Problem Relation Age of Onset  . Breast cancer Sister 67  . Breast cancer Maternal Aunt   . Breast cancer Other   . Breast cancer Other     ALLERGIES:  is allergic to celebrex [celecoxib], rofecoxib, and triamterene-hydrochlorothiazide [hydrochlorothiazide w-triamterene].  MEDICATIONS:  Current Outpatient Medications  Medication Sig Dispense Refill  . acetaminophen (TYLENOL) 500 MG tablet Take 500 mg by mouth every 6 (six) hours as needed for moderate pain.    Marland Kitchen alendronate (FOSAMAX) 70 MG tablet Take 70 mg by mouth every Monday.    Marland Kitchen anastrozole (ARIMIDEX) 1 MG tablet Take 1 tablet (1 mg total) by mouth daily. 90 tablet 3  . aspirin EC 81 MG tablet Take 81 mg by mouth at bedtime.    . cholecalciferol (VITAMIN D) 1000 units tablet Take 1,000 Units by mouth daily.    Marland Kitchen donepezil (ARICEPT) 5 MG tablet Take 5 mg by mouth at bedtime.    . ferrous sulfate 325 (65 FE) MG tablet Take 325 mg by mouth daily with breakfast.    . gabapentin (NEURONTIN) 100 MG capsule Take 100 mg by mouth at bedtime.    Marland Kitchen levothyroxine (SYNTHROID, LEVOTHROID) 75 MCG tablet Take 75 mcg by mouth daily before breakfast.    . lisinopril (PRINIVIL,ZESTRIL) 40 MG tablet Take 40 mg by mouth every morning.    . Melatonin 3 MG CAPS Take 3 mg by mouth at bedtime.    . memantine (NAMENDA) 5 MG tablet Take 5 mg by mouth 2  (two) times daily.    Marland Kitchen omeprazole (PRILOSEC) 20 MG capsule Take 20 mg by mouth every morning.    . Probiotic Product ([place]) CAPS Take 1 capsule by mouth daily.    Marland Kitchen rOPINIRole (REQUIP) 0.5 MG tablet Take 0.5 mg by mouth at bedtime.    . vitamin B-12 (CYANOCOBALAMIN) 1000 MCG tablet Take 1,000 mcg by mouth daily.     No current facility-administered medications for this visit.      Marland Kitchen  PHYSICAL EXAMINATION: ECOG PERFORMANCE STATUS: 0 - Asymptomatic  Vitals:   10/29/20 1448  BP: (!) 160/57  Pulse: (!) 55  Resp: 18  Temp: (!) 96.5 F (35.8 C)  SpO2: 100%   Filed Weights   10/29/20 1448  Weight: 180 lb 9.6 oz (81.9 kg)    Physical Exam HENT:     Head: Normocephalic and atraumatic.  Mouth/Throat:     Pharynx: No oropharyngeal exudate.  Eyes:     Pupils: Pupils are equal, round, and reactive to light.  Cardiovascular:     Rate and Rhythm: Normal rate and regular rhythm.  Pulmonary:     Effort: No respiratory distress.     Breath sounds: No wheezing.  Abdominal:     General: Bowel sounds are normal. There is no distension.     Palpations: Abdomen is soft. There is no mass.     Tenderness: There is no abdominal tenderness. There is no guarding or rebound.  Musculoskeletal:        General: No tenderness. Normal range of motion.     Cervical back: Normal range of motion and neck supple.  Skin:    General: Skin is warm.     Comments: Mild erythema noted 12 o'clock position-no concerns for any infection.  Incision well healing.  Neurological:     Mental Status: She is alert and oriented to person, place, and time.  Psychiatric:        Mood and Affect: Affect normal.      LABORATORY DATA:  I have reviewed the data as listed Lab Results  Component Value Date   WBC 7.4 08/19/2015   HGB 13.2 08/19/2015   HCT 39.7 08/19/2015   MCV 94.7 08/19/2015   PLT 176 08/19/2015   No results for input(s): NA, K, CL, CO2, GLUCOSE, BUN, CREATININE, CALCIUM,  GFRNONAA, GFRAA, PROT, ALBUMIN, AST, ALT, ALKPHOS, BILITOT, BILIDIR, IBILI in the last 8760 hours.  RADIOGRAPHIC STUDIES: I have personally reviewed the radiological images as listed and agreed with the findings in the report. No results found.  ASSESSMENT & PLAN:   Carcinoma of upper-outer quadrant of left breast in female, estrogen receptor positive ([place]) #Stage I-breast cancer ER/PR positive HER2 negative.  Stage IA.  No role for any adjuvant chemotherapy/RT [sec to dementia/age] on anastrozole.  # 2020- Osteoporosis.Interval decrease from 07-31-2008- on Fosomax [PCP] ; ca+vit D.   # dementia- mild- moderate-continue Aricept/Namenda.  # DISPOSITION: # follow up in 6 months MD;  Labs-cbc/cmp-- Dr.B  All questions were answered. The patient/family knows to call the clinic with any problems, questions or concerns.    Cammie Sickle, MD 10/30/2020 8:20 AM

## 2020-10-29 NOTE — Assessment & Plan Note (Addendum)
#  Stage I-breast cancer ER/PR positive HER2 negative.  Stage IA.  No role for any adjuvant chemotherapy/RT [sec to dementia/age] on anastrozole.  # 2020- Osteoporosis.Interval decrease from 07-31-2008- on Fosomax [PCP] ; ca+vit D.   # dementia- mild- moderate-continue Aricept/Namenda.  # DISPOSITION: # follow up in 6 months MD;  Labs-cbc/cmp-- Dr.B

## 2020-10-30 DIAGNOSIS — R300 Dysuria: Secondary | ICD-10-CM | POA: Diagnosis not present

## 2020-11-01 ENCOUNTER — Other Ambulatory Visit (HOSPITAL_COMMUNITY): Payer: Self-pay

## 2020-11-01 ENCOUNTER — Telehealth: Payer: Self-pay | Admitting: Unknown Physician Specialty

## 2020-11-01 MED ORDER — NIRMATRELVIR/RITONAVIR (PAXLOVID)TABLET
2.0000 | ORAL_TABLET | Freq: Two times a day (BID) | ORAL | 0 refills | Status: AC
  Filled 2020-11-01: qty 20, 5d supply, fill #0

## 2020-11-01 NOTE — Telephone Encounter (Signed)
Outpatient Oral COVID Treatment Note  I connected with Tonishia Steffy Lucier's daughter on 11/01/2020/10:15 AM by telephone and verified that I am speaking with the correct person using two identifiers.  I discussed the limitations, risks, security, and privacy concerns of performing an evaluation and management service by telephone and the availability of in person appointments. I also discussed with the patient that there may be a patient responsible charge related to this service. The patient expressed understanding and agreed to proceed.  Patient location: home Provider location: home  Diagnosis: COVID-19 infection  Purpose of visit: Discussion of potential use of Molnupiravir or Paxlovid, a new treatment for mild to moderate COVID-19 viral infection in non-hospitalized patients.   Subjective: Patient is a 87 y.o. female who has been diagnosed with COVID 19 viral infection.  Their symptoms began on 4/15 with cough and fever.    Past Medical History:  Diagnosis Date  . Anemia   . Arthritis   . Cancer ([place])    breast-left  . CHF (congestive heart failure) ([place])   . Chronic kidney disease   . DVT (deep venous thrombosis) ([place]) 2000   after knee replacement  . GERD (gastroesophageal reflux disease)   . Hypertension   . Hypothyroidism     Allergies  Allergen Reactions  . Celebrex [Celecoxib]     Unknown reaction  . Rofecoxib     Unknown reaction  . Triamterene-Hydrochlorothiazide [Hydrochlorothiazide W-Triamterene]     Unknown reaction     Current Outpatient Medications:  .  acetaminophen (TYLENOL) 500 MG tablet, Take 500 mg by mouth every 6 (six) hours as needed for moderate pain., Disp: , Rfl:  .  alendronate (FOSAMAX) 70 MG tablet, Take 70 mg by mouth every Monday., Disp: , Rfl:  .  anastrozole (ARIMIDEX) 1 MG tablet, Take 1 tablet (1 mg total) by mouth daily., Disp: 90 tablet, Rfl: 3 .  aspirin EC 81 MG tablet, Take 81 mg by mouth at bedtime., Disp: , Rfl:  .  cholecalciferol  (VITAMIN D) 1000 units tablet, Take 1,000 Units by mouth daily., Disp: , Rfl:  .  donepezil (ARICEPT) 5 MG tablet, Take 5 mg by mouth at bedtime., Disp: , Rfl:  .  ferrous sulfate 325 (65 FE) MG tablet, Take 325 mg by mouth daily with breakfast., Disp: , Rfl:  .  gabapentin (NEURONTIN) 100 MG capsule, Take 100 mg by mouth at bedtime., Disp: , Rfl:  .  levothyroxine (SYNTHROID, LEVOTHROID) 75 MCG tablet, Take 75 mcg by mouth daily before breakfast., Disp: , Rfl:  .  lisinopril (PRINIVIL,ZESTRIL) 40 MG tablet, Take 40 mg by mouth every morning., Disp: , Rfl:  .  Melatonin 3 MG CAPS, Take 3 mg by mouth at bedtime., Disp: , Rfl:  .  memantine (NAMENDA) 5 MG tablet, Take 5 mg by mouth 2 (two) times daily., Disp: , Rfl:  .  omeprazole (PRILOSEC) 20 MG capsule, Take 20 mg by mouth every morning., Disp: , Rfl:  .  Probiotic Product ([place]) CAPS, Take 1 capsule by mouth daily., Disp: , Rfl:  .  rOPINIRole (REQUIP) 0.5 MG tablet, Take 0.5 mg by mouth at bedtime., Disp: , Rfl:  .  vitamin B-12 (CYANOCOBALAMIN) 1000 MCG tablet, Take 1,000 mcg by mouth daily., Disp: , Rfl:   Objective: Patient appears/sounds congested and coughing according to daughter.  They are in no apparent distress.  Breathing is non labored.  Mood and behavior are normal.  Laboratory Data:  Recent Results (from the  past 2160 hour(s))  SARS CORONAVIRUS 2 (TAT 6-24 HRS) Nasopharyngeal Nasopharyngeal Swab     Status: None   Collection Time: 08/11/20 12:50 PM   Specimen: Nasopharyngeal Swab  Result Value Ref Range   SARS Coronavirus 2 NEGATIVE NEGATIVE    Comment: (NOTE) SARS-CoV-2 target nucleic acids are NOT DETECTED.  The SARS-CoV-2 RNA is generally detectable in upper and lower respiratory specimens during the acute phase of infection. Negative results do not preclude SARS-CoV-2 infection, do not rule out co-infections with other pathogens, and should not be used as the sole basis for treatment or other patient  management decisions. Negative results must be combined with clinical observations, patient history, and epidemiological information. The expected result is Negative.  Fact Sheet for Patients: SugarRoll.be  Fact Sheet for Healthcare Providers: https://www.woods-mathews.com/  This test is not yet approved or cleared by the [place] FDA and  has been authorized for detection and/or diagnosis of SARS-CoV-2 by FDA under an Emergency Use Authorization (EUA). This EUA will remain  in effect (meaning this test can be used) for the duration of the COVID-19 declaration under Se ction 564(b)(1) of the Act, 21 U.S.C. section 360bbb-3(b)(1), unless the authorization is terminated or revoked sooner.  Performed at [facility], [address] [place], [place] [address]   Surgical pathology     Status: None   Collection Time: 08/13/20 10:26 AM  Result Value Ref Range   SURGICAL PATHOLOGY      SURGICAL PATHOLOGY CASE: [phone] PATIENT: Anna [facility] Surgical Pathology Report     Specimen Submitted: A. Left breast mass B. Left breast, anterior lateral C. Lymph node, sentinel  Clinical History: V94,801 Malignant neoplasm of upper-outer quadrant of left female breast, unspecified estrogen receptor      DIAGNOSIS: A. BREAST, LEFT; PARTIAL MASTECTOMY WITH RADIOFREQUENCY LOCALIZATION: - INVASIVE MAMMARY CARCINOMA. - DUCTAL CARCINOMA IN SITU (DCIS). - SEE CANCER SUMMARY BELOW. - BIOPSY SITE CHANGE. - RF ID AND VISION CLIP PRESENT. - VASCULAR CALCIFICATION.  B.  BREAST, LEFT ANTERIOR LATERAL; EXCISION: - UNREMARKABLE BREAST TISSUE. - NEGATIVE FOR ATYPIA AND MALIGNANCY.  C.  LYMPH NODE, LEFT SENTINEL; EXCISION: - ONE LYMPH NODE NEGATIVE FOR MALIGNANCY (0/1).  CANCER CASE SUMMARY: INVASIVE CARCINOMA OF THE BREAST Standard(s): AJCC-UICC 8  SPECIMEN Procedure: Partial mastectomy with radiofrequency localization Specimen  Laterality: Left  T UMOR Histologic Type: Invasive mammary carcinoma of no special type Histologic Grade (Nottingham Histologic Score)                      Glandular (Acinar)/Tubular Differentiation: 3                      Nuclear Pleomorphism: 2                      Mitotic Rate: 1                      Overall Grade: 2 Tumor Size: 6 mm Ductal Carcinoma In Situ (DCIS): Present, nuclear grade 1 without comedonecrosis Lymphovascular Invasion: Not identified Treatment Effect in the Breast: No known presurgical therapy  MARGINS Margin Status for Invasive Carcinoma: All margins negative for invasive carcinoma                      Distance from closest margin: 8 mm  Specify closest margin: Lateral  Margin Status for DCIS: All margins negative for DCIS                      Distance from DCIS to closest margin: 8 mm                      Specify closest margin: Lateral   REGIONAL LYMPH NODES Regional Lymph Node Status: All regional lymph nodes negative for tumor                       Total Number of Lymph Nodes Examined (sentinel and non-sentinel): 1                      Number of Sentinel Nodes Examined: 1  DISTANT METASTASIS Distant Site(s) Involved, if applicable: Not applicable  PATHOLOGIC STAGE CLASSIFICATION (pTNM, AJCC 8th Edition): TNM Descriptors: Not applicable [id number] Regional Lymph Nodes Modifier: sn pN0 pM - Not applicable  SPECIAL STUDIES Breast Biomarker Testing Performed on Previous Biopsy: [id number] Estrogen Receptor (ER) Status: Positive Progesterone Receptor (PgR) Status: Positive HER2 (by immunohistochemistry): Negative  Comment: The residual invasive carcinoma measures 0.5 cm in this specimen. The invasive carcinoma measured 0.6 cm in the prior biopsy. The measurement of 0.6 cm was used in the above AJCC staging.  GROSS DESCRIPTION: A.  Intraoperative Consultation:     Labeled: Left breast mass     Received: Fresh     Specimen:  Partial mastectomy with radiofrequency localizer     Pathologic evaluation  performed: Gross margin evaluation     Diagnosis: IOC: Mass with clip.  0.5 cm to closest anterior margin.     Communicated to: Dr. Windell Moment at 10:50 AM on 08/13/2018 by Quay Burow, M.D.     Tissue submitted: Not applicable  A. Labeled: Left breast mass Received: Fresh for intraoperative consultation Specimen radiograph image(s) available for review Radiographic findings: A biopsy clip and RF ID tag are present. Time in fixative: Collected at 10:26 AM and placed into formalin at 10:50 AM on 08/13/2018.                                   Cold ischemic time: Less than 1 hour Total fixation time: 10 hours Type of procedure: Partial mastectomy with radiofrequency localizer Location / laterality of specimen: Left breast Orientation of specimen: Attached to the specimen are plastic surgical clips designating superior, inferior, medial, lateral, and anterior. The specimen is received previously inked according to the inking scheme below. Inking: Anterior = green Infer ior = blue Lateral = orange Medial = yellow Posterior = black Superior = red Size of specimen: 11.0 (medial-lateral) x 8.5 (superior-inferior) x 3.1 cm (anterior-posterior) Skin: Absent Biopsy site: Present Number of discrete masses: 1 Size of mass(es): 0.5 x 0.5 x 0.5 cm Description of mass(es): Sectioning displays an ill-defined, spiculated, indurated mass with a pale tan cut surface.  Embedded within the mass is a vision-shaped biopsy clip.  Immediately surrounding the mass is pale tan, stranding, slightly fibrous tissue admixed with multifocal areas of hemorrhage (suspicious for biopsy site changes) with an overall measurement of 1.5 x 1.0 x 0.7 cm. Distance between masses/clips: Not applicable Margins in relation to mass: Anterior - 0.5 cm, posterior - 1.5 cm, superior - 3.6 cm, inferior - 2.5 cm, lateral - 0.8 cm, and medial -  7.2  cm. Description of remainder of tissue: Sectioning the remainder of the specimen displays tan-yellow, lobulated, otherwise grossl y unremarkable fibroadipose tissue with a fibrous to adipose ratio of 40:60.  No additional abnormalities or mass lesions are grossly identified.  Block summary: 1-7 - entire mass and surrounding fibrous tissue in relation to anterior and lateral resection margins (submitted from lateral to medial; biopsy clip site in cassette 3) 8 - grossly uninvolved breast parenchyma immediately lateral to mass 9 - grossly uninvolved breast parenchyma immediately medial to mass 10 - representative sections of predominant fibrous tissue located between mass and medial aspect of specimen 11 - posterior resection margin closest to mass 12 - superior resection margin closest to mass 13 - inferior resection margin closest to mass 14 - medial resection margin closest to mass  B.  Intraoperative Consultation:     Labeled: Left breast anterior-lateral (stitch inside cavity)     Received: Fresh     Specimen: Additional margin performed during partial mastectomy with radiofrequency locali zer     Pathologic evaluation performed: Gross margin evaluation     Diagnosis: IOC: No discrete mass identified.     Communicated to: Dr. Windell Moment at 11:12 AM on 08/13/2020 by Quay Burow, M.D.     Tissue submitted: Not applicable  B. Labeled: Left breast anterior-lateral (stitch inside cavity) Received: Fresh for intraoperative consultation Time in fixative: Collected at 10:56 AM and placed into formalin at 11:12 AM on 08/13/2020.                                   Cold ischemic time: Less than 1 hour Total fixation time: 10 hours Type of procedure: Additional margin performed during partial mastectomy with radiofrequency localizer Location / laterality of specimen: Left breast, additional anterior-lateral resection margin Orientation of specimen: The specimen is received  previously inked according to the inking scheme below.  There is a black stitch involving the posterior aspect of the specimen designating inside cavity. Inking: Anterior = green Inferior = b lue Lateral = orange Medial = yellow Posterior = black (inside cavity) Superior = red Size of specimen: 6.5 (medial-lateral) x 3.5 (superior-inferior) x 1.3 cm (anterior-posterior) Skin: Absent Biopsy site: None grossly identified Number of discrete masses: None grossly identified Description of tissue: The breast parenchyma involving the posterior aspect of the specimen and surrounding the black stitch is slightly disrupted and ragged (suspicious for cautery artifact due to previous, adjacent lumpectomy site).  Sectioning the remainder of the specimen displays tan-yellow, lobulated, otherwise grossly unremarkable fibroadipose tissue with a fibrous to adipose ratio of 5:95.  No abnormalities, mass lesions, or residual lesional tissue is grossly identified.  Block summary: 1-4 - representative, full-thickness sections displaying anterior, posterior, superior, and inferior resection margins (submitted from medial to lateral; black stitch site in cassette 2) 5 - rep resentative sections of medial resection margin 6 - representative sections of lateral resection margin  C. Labeled: Sentinel node Received: Formalin Collection time: Collected at 10:40 AM on 08/13/2020 Placed into formalin time: Placed into formalin at 11:07 AM on 08/13/2020 Tissue fragment(s): 1 Size: 2.2 x 1.3 x 0.9 cm Description: Received is an irregular fragment of tan-yellow adipose tissue.  Palpation reveals 1 lymph node candidate measuring 2.2 x 1.1 x 0.5 cm. The lymph node candidate and surrounding adipose tissue are serially sectioned and entirely submitted in cassette 1.  Final Diagnosis performed by Quay Burow, MD.   Electronically signed  08/14/2020 2:24:48PM The electronic signature indicates that the named  Attending Pathologist has evaluated the specimen Technical component performed at [facility], [address], [place], [place] [address] Lab: [phone] Dir: Rush Farmer, MD, MMM  Professional component performed at [facility], [facility], [place], [place], [place] [address] Lab: [phone] Dir: Dellia Nims. Rubinas, MD      Assessment: 87 y.o. female with mild/moderate COVID 19 viral infection diagnosed on 4/15 at high risk for progression to severe COVID 19.  Plan:  This patient is a 87 y.o. female that meets the following criteria for Emergency Use Authorization of: Paxlovid 1. Age >12 yr AND > 40 kg 2. SARS-COV-2 positive test 3. Symptom onset < 5 days 4. Mild-to-moderate COVID disease with high risk for severe progression to hospitalization or death  I have spoken and communicated the following to the patient or parent/caregiver regarding: 1. Paxlovid is an unapproved drug that is authorized for use under an Emergency Use Authorization.  2. There are no adequate, approved, available products for the treatment of COVID-19 in adults who have mild-to-moderate COVID-19 and are at high risk for progressing to severe COVID-19, including hospitalization or death. 3. Other therapeutics are currently authorized. For additional information on all products authorized for treatment or prevention of COVID-19, please see TanEmporium.pl.  4. There are benefits and risks of taking this treatment as outlined in the "Fact Sheet for Patients and Caregivers."  5. "Fact Sheet for Patients and Caregivers" was reviewed with patient. A hard copy will be provided to patient from pharmacy prior to the patient receiving treatment. 6. Patients should continue to self-isolate and use infection control measures (e.g., wear mask, isolate, social distance, avoid sharing personal items,  clean and disinfect "high touch" surfaces, and frequent handwashing) according to CDC guidelines.  7. The patient or parent/caregiver has the option to accept or refuse treatment. 8. Patient medication history was reviewed for potential drug interactions:No drug interactions 9. Patient's GFR was calculated to be 39, and they were therefore prescribed Reduced dose (GFR 30-60) - nirmatrelvir 188m tab (1 tablet) by mouth twice daily AND ritonavir 1038mtab (1 tablet) by mouth twice daily   After reviewing above information with the patient, the patient agrees to receive Paxlovid.  Follow up instructions:    . Take prescription BID x 5 days as directed . Reach out to pharmacist for counseling on medication if desired . For concerns regarding further COVID symptoms please follow up with your PCP or urgent care . For urgent or life-threatening issues, seek care at your local emergency department  The patient was provided an opportunity to ask questions, and all were answered. The patient agreed with the plan and demonstrated an understanding of the instructions.   [facility]  The patient was advised to call their PCP or seek an in-person evaluation if the symptoms worsen or if the condition fails to improve as anticipated.   I provided 20 minutes of non face-to-face telephone visit time during this encounter, and > 50% was spent counseling as documented under my assessment & plan.  ChKathrine HaddockNP 11/01/2020 /10:15 AM

## 2020-12-23 DIAGNOSIS — N289 Disorder of kidney and ureter, unspecified: Secondary | ICD-10-CM | POA: Diagnosis not present

## 2020-12-23 DIAGNOSIS — E039 Hypothyroidism, unspecified: Secondary | ICD-10-CM | POA: Diagnosis not present

## 2020-12-23 DIAGNOSIS — K219 Gastro-esophageal reflux disease without esophagitis: Secondary | ICD-10-CM | POA: Diagnosis not present

## 2020-12-23 DIAGNOSIS — I10 Essential (primary) hypertension: Secondary | ICD-10-CM | POA: Diagnosis not present

## 2020-12-23 DIAGNOSIS — C50912 Malignant neoplasm of unspecified site of left female breast: Secondary | ICD-10-CM | POA: Diagnosis not present

## 2021-03-24 DIAGNOSIS — I10 Essential (primary) hypertension: Secondary | ICD-10-CM | POA: Diagnosis not present

## 2021-03-24 DIAGNOSIS — E079 Disorder of thyroid, unspecified: Secondary | ICD-10-CM | POA: Diagnosis not present

## 2021-03-24 DIAGNOSIS — M1990 Unspecified osteoarthritis, unspecified site: Secondary | ICD-10-CM | POA: Diagnosis not present

## 2021-03-24 DIAGNOSIS — R413 Other amnesia: Secondary | ICD-10-CM | POA: Diagnosis not present

## 2021-03-24 DIAGNOSIS — R7303 Prediabetes: Secondary | ICD-10-CM | POA: Diagnosis not present

## 2021-03-24 DIAGNOSIS — N1832 Chronic kidney disease, stage 3b: Secondary | ICD-10-CM | POA: Diagnosis not present

## 2021-03-24 DIAGNOSIS — N644 Mastodynia: Secondary | ICD-10-CM | POA: Diagnosis not present

## 2021-03-24 DIAGNOSIS — D649 Anemia, unspecified: Secondary | ICD-10-CM | POA: Diagnosis not present

## 2021-03-31 ENCOUNTER — Telehealth: Payer: Self-pay | Admitting: *Deleted

## 2021-03-31 DIAGNOSIS — I10 Essential (primary) hypertension: Secondary | ICD-10-CM | POA: Diagnosis not present

## 2021-03-31 DIAGNOSIS — Z0000 Encounter for general adult medical examination without abnormal findings: Secondary | ICD-10-CM | POA: Diagnosis not present

## 2021-03-31 DIAGNOSIS — F0390 Unspecified dementia without behavioral disturbance: Secondary | ICD-10-CM | POA: Diagnosis not present

## 2021-03-31 DIAGNOSIS — Z79899 Other long term (current) drug therapy: Secondary | ICD-10-CM | POA: Diagnosis not present

## 2021-03-31 DIAGNOSIS — Z1389 Encounter for screening for other disorder: Secondary | ICD-10-CM | POA: Diagnosis not present

## 2021-03-31 DIAGNOSIS — R7309 Other abnormal glucose: Secondary | ICD-10-CM | POA: Insufficient documentation

## 2021-03-31 DIAGNOSIS — R202 Paresthesia of skin: Secondary | ICD-10-CM | POA: Diagnosis not present

## 2021-03-31 DIAGNOSIS — C50412 Malignant neoplasm of upper-outer quadrant of left female breast: Secondary | ICD-10-CM

## 2021-03-31 DIAGNOSIS — Z170 Estrogen receptor positive status [ER+]: Secondary | ICD-10-CM

## 2021-03-31 DIAGNOSIS — Z23 Encounter for immunization: Secondary | ICD-10-CM | POA: Diagnosis not present

## 2021-03-31 NOTE — Telephone Encounter (Signed)
Mr Rehfeldt called asking when patient needs her mammogram done stating he understood she needs it every 6 months

## 2021-04-01 ENCOUNTER — Telehealth: Payer: Self-pay | Admitting: Internal Medicine

## 2021-04-01 NOTE — Telephone Encounter (Signed)
[facility] [place] - Please schedule pt for Mammo. Contact husband with new apts.

## 2021-04-01 NOTE — Telephone Encounter (Signed)
04/01/2021  Left VM informing pt that mammogram has been scheduled for 12/27 @ 10:40. Gave call back number in case there were any further questions or concerns  SRW

## 2021-04-15 DIAGNOSIS — Z23 Encounter for immunization: Secondary | ICD-10-CM | POA: Diagnosis not present

## 2021-04-15 DIAGNOSIS — C50912 Malignant neoplasm of unspecified site of left female breast: Secondary | ICD-10-CM | POA: Diagnosis not present

## 2021-04-15 DIAGNOSIS — R413 Other amnesia: Secondary | ICD-10-CM | POA: Diagnosis not present

## 2021-04-15 DIAGNOSIS — S61256A Open bite of right little finger without damage to nail, initial encounter: Secondary | ICD-10-CM | POA: Diagnosis not present

## 2021-04-15 DIAGNOSIS — I10 Essential (primary) hypertension: Secondary | ICD-10-CM | POA: Diagnosis not present

## 2021-04-15 DIAGNOSIS — L989 Disorder of the skin and subcutaneous tissue, unspecified: Secondary | ICD-10-CM | POA: Diagnosis not present

## 2021-04-15 DIAGNOSIS — W540XXA Bitten by dog, initial encounter: Secondary | ICD-10-CM | POA: Diagnosis not present

## 2021-04-15 DIAGNOSIS — F339 Major depressive disorder, recurrent, unspecified: Secondary | ICD-10-CM | POA: Diagnosis not present

## 2021-04-28 ENCOUNTER — Other Ambulatory Visit: Payer: Self-pay | Admitting: *Deleted

## 2021-04-28 ENCOUNTER — Other Ambulatory Visit: Payer: Self-pay

## 2021-04-28 DIAGNOSIS — Z170 Estrogen receptor positive status [ER+]: Secondary | ICD-10-CM

## 2021-04-28 DIAGNOSIS — C50412 Malignant neoplasm of upper-outer quadrant of left female breast: Secondary | ICD-10-CM

## 2021-04-29 ENCOUNTER — Other Ambulatory Visit: Payer: Self-pay

## 2021-04-29 ENCOUNTER — Inpatient Hospital Stay: Payer: Medicare HMO | Attending: Internal Medicine

## 2021-04-29 ENCOUNTER — Encounter: Payer: Self-pay | Admitting: Internal Medicine

## 2021-04-29 ENCOUNTER — Inpatient Hospital Stay: Payer: Medicare HMO | Admitting: Internal Medicine

## 2021-04-29 VITALS — BP 139/65 | HR 55 | Temp 97.9°F | Resp 16 | Wt 183.4 lb

## 2021-04-29 DIAGNOSIS — Z803 Family history of malignant neoplasm of breast: Secondary | ICD-10-CM | POA: Diagnosis not present

## 2021-04-29 DIAGNOSIS — C50412 Malignant neoplasm of upper-outer quadrant of left female breast: Secondary | ICD-10-CM

## 2021-04-29 DIAGNOSIS — Z79899 Other long term (current) drug therapy: Secondary | ICD-10-CM | POA: Insufficient documentation

## 2021-04-29 DIAGNOSIS — N189 Chronic kidney disease, unspecified: Secondary | ICD-10-CM | POA: Diagnosis not present

## 2021-04-29 DIAGNOSIS — Z170 Estrogen receptor positive status [ER+]: Secondary | ICD-10-CM | POA: Diagnosis not present

## 2021-04-29 DIAGNOSIS — C50912 Malignant neoplasm of unspecified site of left female breast: Secondary | ICD-10-CM

## 2021-04-29 DIAGNOSIS — M810 Age-related osteoporosis without current pathological fracture: Secondary | ICD-10-CM | POA: Diagnosis not present

## 2021-04-29 LAB — COMPREHENSIVE METABOLIC PANEL
ALT: 11 U/L (ref 0–44)
AST: 19 U/L (ref 15–41)
Albumin: 4 g/dL (ref 3.5–5.0)
Alkaline Phosphatase: 67 U/L (ref 38–126)
Anion gap: 10 (ref 5–15)
BUN: 22 mg/dL (ref 8–23)
CO2: 28 mmol/L (ref 22–32)
Calcium: 9.4 mg/dL (ref 8.9–10.3)
Chloride: 104 mmol/L (ref 98–111)
Creatinine, Ser: 1.23 mg/dL — ABNORMAL HIGH (ref 0.44–1.00)
GFR, Estimated: 43 mL/min — ABNORMAL LOW (ref >60)
Glucose, Bld: 121 mg/dL — ABNORMAL HIGH (ref 70–99)
Potassium: 4.6 mmol/L (ref 3.5–5.1)
Sodium: 142 mmol/L (ref 135–145)
Total Bilirubin: 1 mg/dL (ref 0.3–1.2)
Total Protein: 7.1 g/dL (ref 6.5–8.1)

## 2021-04-29 LAB — CBC WITH DIFFERENTIAL/PLATELET
Abs Immature Granulocytes: 0.01 10*3/uL (ref 0.00–0.07)
Basophils Absolute: 0 10*3/uL (ref 0.0–0.1)
Basophils Relative: 1 %
Eosinophils Absolute: 0.1 10*3/uL (ref 0.0–0.5)
Eosinophils Relative: 2 %
HCT: 38.1 % (ref 36.0–46.0)
Hemoglobin: 12.2 g/dL (ref 12.0–15.0)
Immature Granulocytes: 0 %
Lymphocytes Relative: 28 %
Lymphs Abs: 1.5 10*3/uL (ref 0.7–4.0)
MCH: 32 pg (ref 26.0–34.0)
MCHC: 32 g/dL (ref 30.0–36.0)
MCV: 100 fL (ref 80.0–100.0)
Monocytes Absolute: 0.4 10*3/uL (ref 0.1–1.0)
Monocytes Relative: 7 %
Neutro Abs: 3.4 10*3/uL (ref 1.7–7.7)
Neutrophils Relative %: 62 %
Platelets: 170 10*3/uL (ref 150–400)
RBC: 3.81 MIL/uL — ABNORMAL LOW (ref 3.87–5.11)
RDW: 13 % (ref 11.5–15.5)
WBC: 5.5 10*3/uL (ref 4.0–10.5)
nRBC: 0 % (ref 0.0–0.2)

## 2021-04-29 NOTE — Progress Notes (Signed)
Survivorship Care Plan visit completed.  Treatment summary reviewed and given to patient.  ASCO answers booklet reviewed and given to patient.  CARE program and Cancer Transitions discussed with patient along with other resources cancer center offers to patients and caregivers.  Patient verbalized understanding.  Encouraged patient to call for any questions or concerns.

## 2021-04-29 NOTE — Progress Notes (Signed)
Patient here for follow up no questions or concerns today.

## 2021-04-29 NOTE — Progress Notes (Signed)
[facility] [place] NOTE  Patient Care Team: Tracie Harrier, MD as PCP - General (Internal Medicine) Rico Junker, RN as Registered Nurse Cammie Sickle, MD as Consulting Physician (Internal Medicine) Herbert Pun, MD as Consulting Physician (General Surgery)  CHIEF COMPLAINTS/PURPOSE OF CONSULTATION: Breast cancer  #  Oncology History Overview Note  # DEC 2021- LEFT BREAST CA; ER >90%; PR-1-10%; her 2 NEG; G-1; NO LVI; mammo- 2 o'clock 10 cm from the nipple demonstrating an irregular hypoechoic mass measuring 0.5 x 0.5 x 0.5 cm. [Dr.Cintron]; pT1 pN0- Stage IA s/p lumpectomy; NO RT [age/dementia]  # FEB 16th, 2022- Anastrazole  # SURVIVORSHIP:   # GENETICS:   DIAGNOSIS:   STAGE:         ;  GOALS:  CURRENT/MOST RECENT THERAPY :     Carcinoma of upper-outer quadrant of left breast in female, estrogen receptor positive ([place])  07/29/2020 Initial Diagnosis   Carcinoma of upper-outer quadrant of left breast in female, estrogen receptor positive ([place])   07/30/2020 Cancer Staging   Staging form: Breast, AJCC 8th Edition - Clinical: Stage IA (cT1, cN0, cM0, G1, ER+, PR+, HER2-) - Signed by Cammie Sickle, MD on 07/30/2020   09/03/2020 Cancer Staging   Staging form: Breast, AJCC 8th Edition - Pathologic stage from 09/03/2020: No Stage Recommended (ypT1c, pN0, cM0, G2, ER+, PR+, HER2-) - Signed by Cammie Sickle, MD on 09/03/2020 Stage prefix: Post-therapy Multigene prognostic tests performed: None Histologic grading system: 3 grade system      HISTORY OF PRESENTING ILLNESS: Ambulating independently.  Accompanied by husband. Anna Quinn 87 y.o.  female patient with ER/PR positive stage I breast cancer currently on anastrozole is here for follow-up.  Complaints of mild puffiness around the eyes/nasal stuffiness no swelling the legs.  No nausea no vomiting.  No headaches.  No shortness of breath.   As per family patient  continues to struggle with dementia/memory issues.  Review of Systems  Constitutional:  Negative for chills, diaphoresis, fever, malaise/fatigue and weight loss.  HENT:  Negative for nosebleeds and sore throat.   Eyes:  Negative for double vision.  Respiratory:  Negative for cough, hemoptysis, sputum production, shortness of breath and wheezing.   Cardiovascular:  Negative for chest pain, palpitations, orthopnea and leg swelling.  Gastrointestinal:  Negative for abdominal pain, blood in stool, constipation, diarrhea, heartburn, melena, nausea and vomiting.  Genitourinary:  Negative for dysuria, frequency and urgency.  Musculoskeletal:  Positive for back pain and joint pain.  Skin: Negative.  Negative for itching and rash.  Neurological:  Negative for dizziness, tingling, focal weakness, weakness and headaches.  Endo/Heme/Allergies:  Does not bruise/bleed easily.  Psychiatric/Behavioral:  Positive for memory loss. Negative for depression. The patient is not nervous/anxious and does not have insomnia.     MEDICAL HISTORY:  Past Medical History:  Diagnosis Date   Anemia    Arthritis    Cancer ([place])    breast-left   CHF (congestive heart failure) ([facility])    Chronic kidney disease    DVT (deep venous thrombosis) ([place]) 2000   after knee replacement   GERD (gastroesophageal reflux disease)    Hypertension    Hypothyroidism     SURGICAL HISTORY: Past Surgical History:  Procedure Laterality Date   ABDOMINAL HYSTERECTOMY     BREAST BIOPSY Left 07/17/2020   u/s bx, vision marker, Invasive mammary CA   CATARACT EXTRACTION W/PHACO Left 03/08/2017   Procedure: CATARACT EXTRACTION PHACO AND INTRAOCULAR LENS PLACEMENT ([place]);  Surgeon: Birder Robson, MD;  Location: [facility] ORS;  Service: Ophthalmology;  Laterality: Left;  [place] 01:16.7 AP% 14.7 CDE 11.27 Fluid Pack Lot # [id number]   CHOLECYSTECTOMY     EYE SURGERY     JOINT REPLACEMENT Bilateral    tkr    SOCIAL HISTORY: Social History    Socioeconomic History   Marital status: Married    Spouse name: Not on file   Number of children: Not on file   Years of education: Not on file   Highest education level: Not on file  Occupational History   Not on file  Tobacco Use   Smoking status: Never   Smokeless tobacco: Never  Vaping Use   Vaping Use: Never used  Substance and Sexual Activity   Alcohol use: No   Drug use: Never   Sexual activity: Not on file  Other Topics Concern   Not on file  Social History Narrative   Not on file   Social Determinants of Health   Financial Resource Strain: Not on file  Food Insecurity: Not on file  Transportation Needs: Not on file  Physical Activity: Not on file  Stress: Not on file  Social Connections: Not on file  Intimate Partner Violence: Not on file    FAMILY HISTORY: Family History  Problem Relation Age of Onset   Breast cancer Sister 12   Breast cancer Maternal Aunt    Breast cancer Other    Breast cancer Other     ALLERGIES:  is allergic to celebrex [celecoxib], rofecoxib, and triamterene-hydrochlorothiazide [hydrochlorothiazide w-triamterene].  MEDICATIONS:  Current Outpatient Medications  Medication Sig Dispense Refill   acetaminophen (TYLENOL) 500 MG tablet Take 500 mg by mouth every 6 (six) hours as needed for moderate pain.     alendronate (FOSAMAX) 70 MG tablet Take 70 mg by mouth every Monday.     anastrozole (ARIMIDEX) 1 MG tablet Take 1 tablet (1 mg total) by mouth daily. 90 tablet 3   aspirin EC 81 MG tablet Take 81 mg by mouth at bedtime.     cholecalciferol (VITAMIN D) 1000 units tablet Take 1,000 Units by mouth daily.     donepezil (ARICEPT) 5 MG tablet Take 5 mg by mouth at bedtime.     ferrous sulfate 325 (65 FE) MG tablet Take 325 mg by mouth daily with breakfast.     gabapentin (NEURONTIN) 100 MG capsule Take 100 mg by mouth at bedtime.     levothyroxine (SYNTHROID, LEVOTHROID) 75 MCG tablet Take 75 mcg by mouth daily before breakfast.      lisinopril (PRINIVIL,ZESTRIL) 40 MG tablet Take 40 mg by mouth every morning.     Melatonin 3 MG CAPS Take 3 mg by mouth at bedtime.     memantine (NAMENDA) 5 MG tablet Take 5 mg by mouth 2 (two) times daily.     omeprazole (PRILOSEC) 20 MG capsule Take 20 mg by mouth every morning.     Probiotic Product ([place]) CAPS Take 1 capsule by mouth daily.     rOPINIRole (REQUIP) 0.5 MG tablet Take 0.5 mg by mouth at bedtime.     sertraline (ZOLOFT) 25 MG tablet Take 1 tablet by mouth daily.     vitamin B-12 (CYANOCOBALAMIN) 1000 MCG tablet Take 1,000 mcg by mouth daily.     No current facility-administered medications for this visit.      Marland Kitchen  PHYSICAL EXAMINATION: ECOG PERFORMANCE STATUS: 0 - Asymptomatic  Vitals:   04/29/21 1315  BP: 139/65  Pulse: (!) 55  Resp: 16  Temp: 97.9 F (36.6 C)   Filed Weights   04/29/21 1315  Weight: 183 lb 6.4 oz (83.2 kg)    Physical Exam HENT:     Head: Normocephalic and atraumatic.     Mouth/Throat:     Pharynx: No oropharyngeal exudate.  Eyes:     Pupils: Pupils are equal, round, and reactive to light.  Cardiovascular:     Rate and Rhythm: Normal rate and regular rhythm.  Pulmonary:     Effort: No respiratory distress.     Breath sounds: No wheezing.  Abdominal:     General: Bowel sounds are normal. There is no distension.     Palpations: Abdomen is soft. There is no mass.     Tenderness: There is no abdominal tenderness. There is no guarding or rebound.  Musculoskeletal:        General: No tenderness. Normal range of motion.     Cervical back: Normal range of motion and neck supple.  Skin:    General: Skin is warm.     Comments: Mild erythema noted 12 o'clock position-no concerns for any infection.  Incision well healing.  Neurological:     Mental Status: She is alert and oriented to person, place, and time.  Psychiatric:        Mood and Affect: Affect normal.     LABORATORY DATA:  I have reviewed the data as  listed Lab Results  Component Value Date   WBC 5.5 04/29/2021   HGB 12.2 04/29/2021   HCT 38.1 04/29/2021   MCV 100.0 04/29/2021   PLT 170 04/29/2021   Recent Labs    04/29/21 1250  NA 142  K 4.6  CL 104  CO2 28  GLUCOSE 121*  BUN 22  CREATININE 1.23*  CALCIUM 9.4  GFRNONAA 43*  PROT 7.1  ALBUMIN 4.0  AST 19  ALT 11  ALKPHOS 67  BILITOT 1.0    RADIOGRAPHIC STUDIES: I have personally reviewed the radiological images as listed and agreed with the findings in the report. No results found.  ASSESSMENT & PLAN:   Carcinoma of upper-outer quadrant of left breast in female, estrogen receptor positive ([place]) #Stage I-breast cancer ER/PR positive HER2 negative.  Stage IA.  No role for any adjuvant chemotherapy/RT [sec to dementia/age] on anastrozole.mammo- scheduled in dec 2022- STABLE.   # 2020- Osteoporosis.Interval decrease from 07-31-2008- on Fosomax [PCP] ; ca+vit D.   # Sinus stuffiness: recommend antihistamine at night [discussed re: drowsiness]  # Increased Creatinine- BL- 0.8; currently at 1.23- recommend increase PO fluid intake.    # dementia- mild- moderate-continue Aricept/Namenda- STABLE.  #Anxiety currently on- Zoloft- monitor for now [Dr.Hande]-stable.  # DISPOSITION:labs print out # follow up in 6 months MD;  Labs-cbc/cmp-- Dr.B  All questions were answered. The patient/family knows to call the clinic with any problems, questions or concerns.    Cammie Sickle, MD 04/29/2021 1:59 PM

## 2021-04-29 NOTE — Assessment & Plan Note (Addendum)
#  Stage I-breast cancer ER/PR positive HER2 negative.  Stage IA.  No role for any adjuvant chemotherapy/RT [sec to dementia/age] on anastrozole.mammo- scheduled in dec 2022- STABLE.   # 2020- Osteoporosis.Interval decrease from 07-31-2008- on Fosomax [PCP] ; ca+vit D.   # Sinus stuffiness: recommend antihistamine at night [discussed re: drowsiness]  # Increased Creatinine- BL- 0.8; currently at 1.23- recommend increase PO fluid intake.    # dementia- mild- moderate-continue Aricept/Namenda- STABLE.  #Anxiety currently on- Zoloft- monitor for now [Dr.Hande]-stable.  # DISPOSITION:labs print out # follow up in 6 months MD;  Labs-cbc/cmp-- Dr.B

## 2021-06-08 ENCOUNTER — Telehealth: Payer: Self-pay | Admitting: *Deleted

## 2021-06-08 NOTE — Telephone Encounter (Signed)
Opened in error

## 2021-07-10 ENCOUNTER — Other Ambulatory Visit: Payer: Medicare HMO

## 2021-07-14 ENCOUNTER — Other Ambulatory Visit: Payer: Medicare HMO

## 2021-07-14 ENCOUNTER — Inpatient Hospital Stay: Admission: RE | Admit: 2021-07-14 | Payer: Medicare HMO | Source: Ambulatory Visit

## 2021-07-30 ENCOUNTER — Other Ambulatory Visit: Payer: Medicare HMO

## 2021-08-05 ENCOUNTER — Ambulatory Visit
Admission: RE | Admit: 2021-08-05 | Discharge: 2021-08-05 | Disposition: A | Discharge status: Home / Self Care | Payer: Medicare HMO | Source: Ambulatory Visit | Attending: Internal Medicine | Admitting: Internal Medicine

## 2021-08-05 ENCOUNTER — Other Ambulatory Visit: Payer: Self-pay

## 2021-08-05 DIAGNOSIS — Z170 Estrogen receptor positive status [ER+]: Secondary | ICD-10-CM | POA: Diagnosis not present

## 2021-08-05 DIAGNOSIS — C50412 Malignant neoplasm of upper-outer quadrant of left female breast: Secondary | ICD-10-CM | POA: Diagnosis not present

## 2021-08-05 DIAGNOSIS — R922 Inconclusive mammogram: Secondary | ICD-10-CM | POA: Diagnosis not present

## 2021-08-10 ENCOUNTER — Other Ambulatory Visit: Payer: Self-pay | Admitting: Internal Medicine

## 2021-09-23 DIAGNOSIS — D649 Anemia, unspecified: Secondary | ICD-10-CM | POA: Diagnosis not present

## 2021-09-23 DIAGNOSIS — I10 Essential (primary) hypertension: Secondary | ICD-10-CM | POA: Diagnosis not present

## 2021-09-23 DIAGNOSIS — F331 Major depressive disorder, recurrent, moderate: Secondary | ICD-10-CM | POA: Diagnosis not present

## 2021-09-23 DIAGNOSIS — G2581 Restless legs syndrome: Secondary | ICD-10-CM | POA: Diagnosis not present

## 2021-09-23 DIAGNOSIS — Z23 Encounter for immunization: Secondary | ICD-10-CM | POA: Diagnosis not present

## 2021-09-23 DIAGNOSIS — F0390 Unspecified dementia without behavioral disturbance: Secondary | ICD-10-CM | POA: Diagnosis not present

## 2021-09-23 DIAGNOSIS — C50412 Malignant neoplasm of upper-outer quadrant of left female breast: Secondary | ICD-10-CM | POA: Diagnosis not present

## 2021-09-23 DIAGNOSIS — F411 Generalized anxiety disorder: Secondary | ICD-10-CM | POA: Diagnosis not present

## 2021-09-23 DIAGNOSIS — R7309 Other abnormal glucose: Secondary | ICD-10-CM | POA: Diagnosis not present

## 2021-09-30 DIAGNOSIS — F0390 Unspecified dementia without behavioral disturbance: Secondary | ICD-10-CM | POA: Diagnosis not present

## 2021-09-30 DIAGNOSIS — Z0000 Encounter for general adult medical examination without abnormal findings: Secondary | ICD-10-CM | POA: Diagnosis not present

## 2021-09-30 DIAGNOSIS — F339 Major depressive disorder, recurrent, unspecified: Secondary | ICD-10-CM | POA: Diagnosis not present

## 2021-09-30 DIAGNOSIS — C50912 Malignant neoplasm of unspecified site of left female breast: Secondary | ICD-10-CM | POA: Diagnosis not present

## 2021-09-30 DIAGNOSIS — N289 Disorder of kidney and ureter, unspecified: Secondary | ICD-10-CM | POA: Diagnosis not present

## 2021-09-30 DIAGNOSIS — G479 Sleep disorder, unspecified: Secondary | ICD-10-CM | POA: Diagnosis not present

## 2021-09-30 DIAGNOSIS — Z170 Estrogen receptor positive status [ER+]: Secondary | ICD-10-CM | POA: Diagnosis not present

## 2021-09-30 DIAGNOSIS — I10 Essential (primary) hypertension: Secondary | ICD-10-CM | POA: Diagnosis not present

## 2021-10-28 ENCOUNTER — Inpatient Hospital Stay: Payer: Medicare HMO | Admitting: Medical Oncology

## 2021-10-28 ENCOUNTER — Inpatient Hospital Stay: Payer: Medicare HMO | Attending: Internal Medicine

## 2021-10-28 VITALS — BP 154/58 | HR 62 | Temp 97.2°F | Resp 16 | Ht 65.0 in | Wt 179.0 lb

## 2021-10-28 DIAGNOSIS — C50412 Malignant neoplasm of upper-outer quadrant of left female breast: Secondary | ICD-10-CM | POA: Insufficient documentation

## 2021-10-28 DIAGNOSIS — I509 Heart failure, unspecified: Secondary | ICD-10-CM | POA: Diagnosis not present

## 2021-10-28 DIAGNOSIS — C50912 Malignant neoplasm of unspecified site of left female breast: Secondary | ICD-10-CM

## 2021-10-28 DIAGNOSIS — F0390 Unspecified dementia without behavioral disturbance: Secondary | ICD-10-CM | POA: Insufficient documentation

## 2021-10-28 DIAGNOSIS — Z79899 Other long term (current) drug therapy: Secondary | ICD-10-CM | POA: Insufficient documentation

## 2021-10-28 DIAGNOSIS — Z86718 Personal history of other venous thrombosis and embolism: Secondary | ICD-10-CM | POA: Insufficient documentation

## 2021-10-28 DIAGNOSIS — Z170 Estrogen receptor positive status [ER+]: Secondary | ICD-10-CM

## 2021-10-28 LAB — CBC WITH DIFFERENTIAL/PLATELET
Abs Immature Granulocytes: 0.01 10*3/uL (ref 0.00–0.07)
Basophils Absolute: 0 10*3/uL (ref 0.0–0.1)
Basophils Relative: 1 %
Eosinophils Absolute: 0.2 10*3/uL (ref 0.0–0.5)
Eosinophils Relative: 3 %
HCT: 39 % (ref 36.0–46.0)
Hemoglobin: 12.2 g/dL (ref 12.0–15.0)
Immature Granulocytes: 0 %
Lymphocytes Relative: 40 %
Lymphs Abs: 2.1 10*3/uL (ref 0.7–4.0)
MCH: 30.4 pg (ref 26.0–34.0)
MCHC: 31.3 g/dL (ref 30.0–36.0)
MCV: 97.3 fL (ref 80.0–100.0)
Monocytes Absolute: 0.4 10*3/uL (ref 0.1–1.0)
Monocytes Relative: 8 %
Neutro Abs: 2.5 10*3/uL (ref 1.7–7.7)
Neutrophils Relative %: 48 %
Platelets: 179 10*3/uL (ref 150–400)
RBC: 4.01 MIL/uL (ref 3.87–5.11)
RDW: 13.4 % (ref 11.5–15.5)
WBC: 5.2 10*3/uL (ref 4.0–10.5)
nRBC: 0 % (ref 0.0–0.2)

## 2021-10-28 LAB — COMPREHENSIVE METABOLIC PANEL
ALT: 12 U/L (ref 0–44)
AST: 18 U/L (ref 15–41)
Albumin: 3.9 g/dL (ref 3.5–5.0)
Alkaline Phosphatase: 73 U/L (ref 38–126)
Anion gap: 6 (ref 5–15)
BUN: 24 mg/dL — ABNORMAL HIGH (ref 8–23)
CO2: 28 mmol/L (ref 22–32)
Calcium: 9.8 mg/dL (ref 8.9–10.3)
Chloride: 105 mmol/L (ref 98–111)
Creatinine, Ser: 1.26 mg/dL — ABNORMAL HIGH (ref 0.44–1.00)
GFR, Estimated: 42 mL/min — ABNORMAL LOW (ref >60)
Glucose, Bld: 104 mg/dL — ABNORMAL HIGH (ref 70–99)
Potassium: 5.2 mmol/L — ABNORMAL HIGH (ref 3.5–5.1)
Sodium: 139 mmol/L (ref 135–145)
Total Bilirubin: 1.1 mg/dL (ref 0.3–1.2)
Total Protein: 7 g/dL (ref 6.5–8.1)

## 2021-10-28 NOTE — Progress Notes (Signed)
[facility] [place] ?CONSULT NOTE ? ?Patient Care Team: ?Tracie Harrier, MD as PCP - General (Internal Medicine) ?Rico Junker, RN as Registered Nurse ?Cammie Sickle, MD as Consulting Physician (Internal Medicine) ?Herbert Pun, MD as Consulting Physician (General Surgery) ? ?CHIEF COMPLAINTS/PURPOSE OF CONSULTATION: Breast cancer ? ?#  ?Oncology History Overview Note  ?# DEC 2021- LEFT BREAST CA; ER >90%; PR-1-10%; her 2 NEG; G-1; NO LVI; mammo- 2 o'clock 10 cm from the nipple demonstrating an irregular hypoechoic mass measuring 0.5 x 0.5 x 0.5 cm. [Dr.Cintron]; pT1 pN0- Stage IA s/p lumpectomy; NO RT [age/dementia] ? ?# FEB 16th, 2022- Anastrazole ? ?# SURVIVORSHIP:  ? ?# GENETICS:  ? ?DIAGNOSIS:  ? ?STAGE:         ;  GOALS: ? ?CURRENT/MOST RECENT THERAPY :  ? ?  ?Carcinoma of upper-outer quadrant of left breast in female, estrogen receptor positive ([place])  ?07/29/2020 Initial Diagnosis  ? Carcinoma of upper-outer quadrant of left breast in female, estrogen receptor positive ([place]) ?  ?07/30/2020 Cancer Staging  ? Staging form: Breast, AJCC 8th Edition ?- Clinical: Stage IA (cT1, cN0, cM0, G1, ER+, PR+, HER2-) - Signed by Cammie Sickle, MD on 07/30/2020 ?  ?09/03/2020 Cancer Staging  ? Staging form: Breast, AJCC 8th Edition ?- Pathologic stage from 09/03/2020: No Stage Recommended (ypT1c, pN0, cM0, G2, ER+, PR+, HER2-) - Signed by Cammie Sickle, MD on 09/03/2020 ?Stage prefix: Post-therapy ?Multigene prognostic tests performed: None ?Histologic grading system: 3 grade system ?  ? ? ? ?HISTORY OF PRESENTING ILLNESS: Ambulating independently.  Accompanied by husband. ?Anna Quinn 88 y.o.  female patient with ER/PR positive stage I breast cancer currently on anastrozole is here for follow-up. ? ?Presents with her husband.  They state that she is doing well but continues to struggle with her dementia.  At this time they expressed that they would like to transfer her care back  to her PCP for monitoring of her breast cancer to reduce medical burden of visits.  She denies any breast pain, nipple discharge, masses, breast tenderness, night sweats, unintentional weight loss. Last mammogram was on 08/05/2021 (Benign). They defer DEXA at this time though it has been recommended by our office and her PCP according to husband.  ? ? ?As per family patient continues to struggle with dementia/memory issues. ? ?Review of Systems  ?Constitutional:  Negative for chills, diaphoresis, fever, malaise/fatigue and weight loss.  ?HENT:  Negative for nosebleeds and sore throat.   ?Eyes:  Negative for double vision.  ?Respiratory:  Negative for cough, hemoptysis, sputum production, shortness of breath and wheezing.   ?Cardiovascular:  Negative for chest pain, palpitations, orthopnea and leg swelling.  ?Gastrointestinal:  Negative for abdominal pain, blood in stool, constipation, diarrhea, heartburn, melena, nausea and vomiting.  ?Genitourinary:  Negative for dysuria, frequency and urgency.  ?Musculoskeletal:  Positive for back pain and joint pain.  ?Skin: Negative.  Negative for itching and rash.  ?Neurological:  Negative for dizziness, tingling, focal weakness, weakness and headaches.  ?Endo/Heme/Allergies:  Does not bruise/bleed easily.  ?Psychiatric/Behavioral:  Positive for memory loss. Negative for depression. The patient is not nervous/anxious and does not have insomnia.    ? ?MEDICAL HISTORY:  ?Past Medical History:  ?Diagnosis Date  ? Anemia   ? Arthritis   ? Cancer [facility])   ? breast-left  ? CHF (congestive heart failure) ([place])   ? Chronic kidney disease   ? DVT (deep venous thrombosis) ([place]) 2000  ? after knee  replacement  ? GERD (gastroesophageal reflux disease)   ? Hypertension   ? Hypothyroidism   ? ? ?SURGICAL HISTORY: ?Past Surgical History:  ?Procedure Laterality Date  ? ABDOMINAL HYSTERECTOMY    ? BREAST BIOPSY Left 07/17/2020  ? u/s bx, vision marker, Invasive mammary CA  ? BREAST LUMPECTOMY  Left 08/14/2019  ? [facility] and DCIS clear margins  ? CATARACT EXTRACTION W/PHACO Left 03/08/2017  ? Procedure: CATARACT EXTRACTION PHACO AND INTRAOCULAR LENS PLACEMENT ([facility]);  Surgeon: Birder Robson, MD;  Location: [facility] ORS;  Service: Ophthalmology;  Laterality: Left;  [place] 01:16.7 ?AP% 14.7 ?CDE 11.27 ?Fluid Pack Lot # [id number]  ? CHOLECYSTECTOMY    ? EYE SURGERY    ? JOINT REPLACEMENT Bilateral   ? tkr  ? ? ?SOCIAL HISTORY: ?Social History  ? ?Socioeconomic History  ? Marital status: Married  ?  Spouse name: Not on file  ? Number of children: Not on file  ? Years of education: Not on file  ? Highest education level: Not on file  ?Occupational History  ? Not on file  ?Tobacco Use  ? Smoking status: Never  ? Smokeless tobacco: Never  ?Vaping Use  ? Vaping Use: Never used  ?Substance and Sexual Activity  ? Alcohol use: No  ? Drug use: Never  ? Sexual activity: Not on file  ?Other Topics Concern  ? Not on file  ?Social History Narrative  ? Not on file  ? ?Social Determinants of Health  ? ?Financial Resource Strain: Not on file  ?Food Insecurity: Not on file  ?Transportation Needs: Not on file  ?Physical Activity: Not on file  ?Stress: Not on file  ?Social Connections: Not on file  ?Intimate Partner Violence: Not on file  ? ? ?FAMILY HISTORY: ?Family History  ?Problem Relation Age of Onset  ? Breast cancer Sister 3  ? Breast cancer Maternal Aunt   ? Breast cancer Other   ? Breast cancer Other   ? ? ?ALLERGIES:  is allergic to celebrex [celecoxib], rofecoxib, and triamterene-hydrochlorothiazide [hydrochlorothiazide w-triamterene]. ? ?MEDICATIONS:  ?Current Outpatient Medications  ?Medication Sig Dispense Refill  ? acetaminophen (TYLENOL) 500 MG tablet Take 500 mg by mouth every 6 (six) hours as needed for moderate pain.    ? alendronate (FOSAMAX) 70 MG tablet Take 70 mg by mouth every Monday.    ? anastrozole (ARIMIDEX) 1 MG tablet TAKE 1 TABLET EVERY DAY 90 tablet 3  ? aspirin EC 81 MG tablet Take 81 mg by mouth at  bedtime.    ? cholecalciferol (VITAMIN D) 1000 units tablet Take 1,000 Units by mouth daily.    ? ferrous sulfate 325 (65 FE) MG tablet Take 325 mg by mouth daily with breakfast.    ? levothyroxine (SYNTHROID, LEVOTHROID) 75 MCG tablet Take 75 mcg by mouth daily before breakfast.    ? lisinopril (PRINIVIL,ZESTRIL) 40 MG tablet Take 40 mg by mouth every morning.    ? Melatonin 3 MG CAPS Take 3 mg by mouth at bedtime.    ? omeprazole (PRILOSEC) 20 MG capsule Take 20 mg by mouth every morning.    ? Probiotic Product ([place]) CAPS Take 1 capsule by mouth daily.    ? rOPINIRole (REQUIP) 0.5 MG tablet Take 0.5 mg by mouth at bedtime.    ? sertraline (ZOLOFT) 25 MG tablet Take 1 tablet by mouth daily.    ? vitamin B-12 (CYANOCOBALAMIN) 1000 MCG tablet Take 1,000 mcg by mouth daily.    ? donepezil (ARICEPT) 5 MG  tablet Take 5 mg by mouth at bedtime.    ? gabapentin (NEURONTIN) 100 MG capsule Take 100 mg by mouth at bedtime.    ? memantine (NAMENDA) 5 MG tablet Take 5 mg by mouth 2 (two) times daily.    ? ?No current facility-administered medications for this visit.  ? ? ?  ?. ? ?PHYSICAL EXAMINATION: ?ECOG PERFORMANCE STATUS: 0 - Asymptomatic ? ?Vitals:  ? 10/28/21 1325  ?BP: (!) 154/58  ?Pulse: 62  ?Resp: 16  ?Temp: (!) 97.2 ?F (36.2 ?C)  ?SpO2: 99%  ? ?Filed Weights  ? 10/28/21 1325  ?Weight: 179 lb (81.2 kg)  ? ? ?Physical Exam ?HENT:  ?   Head: Normocephalic and atraumatic.  ?   Mouth/Throat:  ?   Pharynx: No oropharyngeal exudate.  ?Eyes:  ?   Pupils: Pupils are equal, round, and reactive to light.  ?Cardiovascular:  ?   Rate and Rhythm: Normal rate and regular rhythm.  ?Pulmonary:  ?   Effort: No respiratory distress.  ?   Breath sounds: No wheezing.  ?Chest:  ?   Comments: Defers breast exam today ?Abdominal:  ?   General: Bowel sounds are normal. There is no distension.  ?   Palpations: Abdomen is soft. There is no mass.  ?   Tenderness: There is no abdominal tenderness. There is no guarding or  rebound.  ?Musculoskeletal:     ?   General: No tenderness. Normal range of motion.  ?   Cervical back: Normal range of motion and neck supple.  ?Skin: ?   General: Skin is warm.  ?Neurological:  ?   Mental Statu

## 2021-11-09 DIAGNOSIS — Z111 Encounter for screening for respiratory tuberculosis: Secondary | ICD-10-CM | POA: Diagnosis not present

## 2021-11-12 DIAGNOSIS — F0390 Unspecified dementia without behavioral disturbance: Secondary | ICD-10-CM | POA: Diagnosis not present

## 2021-11-12 DIAGNOSIS — N1832 Chronic kidney disease, stage 3b: Secondary | ICD-10-CM | POA: Diagnosis not present

## 2021-11-12 DIAGNOSIS — C50412 Malignant neoplasm of upper-outer quadrant of left female breast: Secondary | ICD-10-CM | POA: Diagnosis not present

## 2021-11-12 DIAGNOSIS — I10 Essential (primary) hypertension: Secondary | ICD-10-CM | POA: Diagnosis not present

## 2021-11-12 DIAGNOSIS — F411 Generalized anxiety disorder: Secondary | ICD-10-CM | POA: Diagnosis not present

## 2021-11-12 DIAGNOSIS — F330 Major depressive disorder, recurrent, mild: Secondary | ICD-10-CM | POA: Diagnosis not present

## 2021-11-12 DIAGNOSIS — Z170 Estrogen receptor positive status [ER+]: Secondary | ICD-10-CM | POA: Diagnosis not present

## 2022-02-08 DIAGNOSIS — I10 Essential (primary) hypertension: Secondary | ICD-10-CM | POA: Diagnosis not present

## 2022-02-08 DIAGNOSIS — F0390 Unspecified dementia without behavioral disturbance: Secondary | ICD-10-CM | POA: Diagnosis not present

## 2022-02-08 DIAGNOSIS — F330 Major depressive disorder, recurrent, mild: Secondary | ICD-10-CM | POA: Diagnosis not present

## 2022-02-08 DIAGNOSIS — F411 Generalized anxiety disorder: Secondary | ICD-10-CM | POA: Diagnosis not present

## 2022-02-08 DIAGNOSIS — N1832 Chronic kidney disease, stage 3b: Secondary | ICD-10-CM | POA: Diagnosis not present

## 2022-03-30 DIAGNOSIS — N1832 Chronic kidney disease, stage 3b: Secondary | ICD-10-CM | POA: Diagnosis not present

## 2022-03-30 DIAGNOSIS — I10 Essential (primary) hypertension: Secondary | ICD-10-CM | POA: Diagnosis not present

## 2022-03-30 DIAGNOSIS — D649 Anemia, unspecified: Secondary | ICD-10-CM | POA: Diagnosis not present

## 2022-03-30 DIAGNOSIS — B349 Viral infection, unspecified: Secondary | ICD-10-CM | POA: Diagnosis not present

## 2022-03-30 DIAGNOSIS — R8290 Unspecified abnormal findings in urine: Secondary | ICD-10-CM | POA: Diagnosis not present

## 2022-03-30 DIAGNOSIS — R7309 Other abnormal glucose: Secondary | ICD-10-CM | POA: Diagnosis not present

## 2022-03-30 DIAGNOSIS — R7303 Prediabetes: Secondary | ICD-10-CM | POA: Diagnosis not present

## 2022-03-30 DIAGNOSIS — M1990 Unspecified osteoarthritis, unspecified site: Secondary | ICD-10-CM | POA: Diagnosis not present

## 2022-03-30 DIAGNOSIS — R413 Other amnesia: Secondary | ICD-10-CM | POA: Diagnosis not present

## 2022-04-06 DIAGNOSIS — Z0000 Encounter for general adult medical examination without abnormal findings: Secondary | ICD-10-CM | POA: Diagnosis not present

## 2022-04-06 DIAGNOSIS — K649 Unspecified hemorrhoids: Secondary | ICD-10-CM | POA: Diagnosis not present

## 2022-04-06 DIAGNOSIS — Z1331 Encounter for screening for depression: Secondary | ICD-10-CM | POA: Diagnosis not present

## 2022-04-06 DIAGNOSIS — D649 Anemia, unspecified: Secondary | ICD-10-CM | POA: Diagnosis not present

## 2022-04-06 DIAGNOSIS — E039 Hypothyroidism, unspecified: Secondary | ICD-10-CM | POA: Diagnosis not present

## 2022-04-06 DIAGNOSIS — F0280 Dementia in other diseases classified elsewhere without behavioral disturbance: Secondary | ICD-10-CM | POA: Diagnosis not present

## 2022-04-06 DIAGNOSIS — R7309 Other abnormal glucose: Secondary | ICD-10-CM | POA: Diagnosis not present

## 2022-04-06 DIAGNOSIS — C50912 Malignant neoplasm of unspecified site of left female breast: Secondary | ICD-10-CM | POA: Diagnosis not present

## 2022-04-06 DIAGNOSIS — K5909 Other constipation: Secondary | ICD-10-CM | POA: Diagnosis not present

## 2022-04-08 ENCOUNTER — Telehealth: Payer: Self-pay | Admitting: Student

## 2022-04-08 NOTE — Telephone Encounter (Signed)
Spoke with husband and discussed Palliative referral/services with him and he wanted his son and daughter to be there for the initial consult, so he will speak with them and call me back to schedule

## 2022-04-15 ENCOUNTER — Telehealth: Payer: Self-pay | Admitting: Student

## 2022-04-15 NOTE — Telephone Encounter (Signed)
Spoke with patient's husband to see if he had talked with his children about Palliative services and to see if they wanted to schedule Palliative Consult, he said they have decided to decline Palliative services at this time and he was in agreement with me cancelling the referral.

## 2022-04-18 ENCOUNTER — Ambulatory Visit
Admission: EM | Admit: 2022-04-18 | Discharge: 2022-04-18 | Disposition: A | Discharge status: Home / Self Care | Payer: Medicare HMO | Attending: Urgent Care | Admitting: Urgent Care

## 2022-04-18 DIAGNOSIS — K219 Gastro-esophageal reflux disease without esophagitis: Secondary | ICD-10-CM | POA: Insufficient documentation

## 2022-04-18 DIAGNOSIS — N3001 Acute cystitis with hematuria: Secondary | ICD-10-CM | POA: Diagnosis not present

## 2022-04-18 DIAGNOSIS — E079 Disorder of thyroid, unspecified: Secondary | ICD-10-CM | POA: Insufficient documentation

## 2022-04-18 DIAGNOSIS — M1990 Unspecified osteoarthritis, unspecified site: Secondary | ICD-10-CM | POA: Insufficient documentation

## 2022-04-18 DIAGNOSIS — I10 Essential (primary) hypertension: Secondary | ICD-10-CM | POA: Insufficient documentation

## 2022-04-18 LAB — POCT URINALYSIS DIP (MANUAL ENTRY)
Bilirubin, UA: NEGATIVE
Glucose, UA: NEGATIVE mg/dL
Ketones, POC UA: NEGATIVE mg/dL
Nitrite, UA: POSITIVE — AB
Protein Ur, POC: 30 mg/dL — AB
Spec Grav, UA: 1.025 (ref 1.010–1.025)
Urobilinogen, UA: 1 E.U./dL
pH, UA: 5.5 (ref 5.0–8.0)

## 2022-04-18 MED ORDER — CEPHALEXIN 500 MG PO CAPS: 500.0000 mg | ORAL_CAPSULE | Freq: Four times a day (QID) | ORAL | 0 refills | Status: AC

## 2022-04-18 MED ORDER — CEPHALEXIN 500 MG PO CAPS: 500.0000 mg | ORAL_CAPSULE | Freq: Four times a day (QID) | ORAL | 0 refills | Status: DC

## 2022-04-18 NOTE — ED Provider Notes (Signed)
[facility]    CSN: [id number] Arrival date & time: 04/18/22  0847      History   Chief Complaint Chief Complaint  Patient presents with   Urinary Frequency    HPI Anna Quinn is a 88 y.o. female.   HPI   Seen at [facility] clinic on 03/30/2022 for dysuria.  UA positive at that time for large leuks.  Culture showed E. coli.  Treated with Ceftin twice daily x 7 days starting 9/15.  Patient states it "tore up her stomach" and stopped treatment early (after 5 days).  Also reports was treated here in the past for UTI and "that medication worked.  Chart shows previous treatment with Keflex 4 times daily x5 days.    Past Medical History:  Diagnosis Date   Anemia    Arthritis    Cancer ([place])    breast-left   CHF (congestive heart failure) ([place])    Chronic kidney disease    DVT (deep venous thrombosis) ([place]) 2000   after knee replacement   GERD (gastroesophageal reflux disease)    Hypertension    Hypothyroidism     Patient Active Problem List   Diagnosis Date Noted   Arthritis 04/18/2022   Esophageal reflux 04/18/2022   Hypertension 04/18/2022   Thyroid disease 04/18/2022   Elevated hemoglobin A1c 03/31/2021   Carcinoma of upper-outer quadrant of left breast in female, estrogen receptor positive ([place]) 07/29/2020   Osteoporosis 11/08/2019   Dementia without behavioral disturbance ([place]) 01/04/2019   Anemia 02/09/2017   GAD (generalized anxiety disorder) 12/11/2015    Past Surgical History:  Procedure Laterality Date   ABDOMINAL HYSTERECTOMY     BREAST BIOPSY Left 07/17/2020   u/s bx, vision marker, Invasive mammary CA   BREAST LUMPECTOMY Left 08/14/2019   [facility] and DCIS clear margins   CATARACT EXTRACTION W/PHACO Left 03/08/2017   Procedure: CATARACT EXTRACTION PHACO AND INTRAOCULAR LENS PLACEMENT ([place]);  Surgeon: Birder Robson, MD;  Location: [facility] ORS;  Service: Ophthalmology;  Laterality: Left;  [place] 01:16.7 AP% 14.7 CDE 11.27 Fluid Pack Lot #  [id number]   CHOLECYSTECTOMY     EYE SURGERY     JOINT REPLACEMENT Bilateral    tkr    OB History   No obstetric history on file.      Home Medications    Prior to Admission medications   Medication Sig Start Date End Date Taking? Authorizing Provider  gabapentin (NEURONTIN) 100 MG capsule TAKE 1 CAPSULE EVERY NIGHT 09/02/21  Yes [provider]  hydrocortisone (ANUSOL-HC) 25 MG suppository Place rectally. 04/06/22 05/06/22 Yes [provider]  lisinopril (ZESTRIL) 40 MG tablet Take 1 tablet by mouth daily. 03/15/22  Yes [provider]  omeprazole (PRILOSEC) 20 MG capsule Take 1 capsule by mouth daily. 03/15/22  Yes [provider]  acetaminophen (TYLENOL) 500 MG tablet Take 500 mg by mouth every 6 (six) hours as needed for moderate pain.    [provider]  alendronate (FOSAMAX) 70 MG tablet Take 70 mg by mouth every Monday. 09/07/19   [provider]  ALPRAZolam Duanne Moron) 0.25 MG tablet Take 0.25 mg by mouth at bedtime as needed. 11/13/21   [provider]  anastrozole (ARIMIDEX) 1 MG tablet TAKE 1 TABLET EVERY DAY 08/10/21   Cammie Sickle, MD  aspirin EC 81 MG tablet Take 81 mg by mouth at bedtime.    [provider]  cefUROXime (CEFTIN) 250 MG tablet Take 250 mg by mouth 2 (two) times  daily. 04/02/22   [provider]  cholecalciferol (VITAMIN D) 1000 units tablet Take 1,000 Units by mouth daily.    [provider]  clonazePAM (KLONOPIN) 0.5 MG tablet Take 0.5 mg by mouth daily as needed. 04/06/22   [provider]  docusate sodium (COLACE) 100 MG capsule Take 100 mg by mouth 2 (two) times daily. 03/01/22   [provider]  donepezil (ARICEPT) 5 MG tablet Take 5 mg by mouth at bedtime. 04/08/20 04/29/21  [provider]  ferrous sulfate 325 (65 FE) MG tablet Take 325 mg by mouth daily with breakfast.    [provider]  gabapentin (NEURONTIN) 100 MG capsule Take  100 mg by mouth at bedtime. 05/08/20 04/29/21  [provider]  hydrocortisone 2.5 % cream Apply topically. 12/02/21   [provider]  levothyroxine (SYNTHROID, LEVOTHROID) 75 MCG tablet Take 75 mcg by mouth daily before breakfast.    [provider]  LINZESS 72 MCG capsule Take 72 mcg by mouth every morning. 04/06/22   [provider]  lisinopril (PRINIVIL,ZESTRIL) 40 MG tablet Take 40 mg by mouth every morning.    [provider]  Melatonin 3 MG CAPS Take 3 mg by mouth at bedtime.    [provider]  memantine (NAMENDA) 5 MG tablet Take 5 mg by mouth 2 (two) times daily. 06/16/20 06/16/21  [provider]  omeprazole (PRILOSEC) 20 MG capsule Take 20 mg by mouth every morning.    [provider]  predniSONE (DELTASONE) 10 MG tablet Take 10 mg by mouth daily. 12/02/21   [provider]  Probiotic Product ([place]) CAPS Take 1 capsule by mouth daily.    [provider]  QUEtiapine (SEROQUEL) 25 MG tablet Take 25 mg by mouth at bedtime. 12/03/21   [provider]  rOPINIRole (REQUIP) 0.5 MG tablet Take 0.5 mg by mouth at bedtime. 05/19/20   [provider]  sertraline (ZOLOFT) 25 MG tablet Take 1 tablet by mouth daily. 03/31/21 03/31/22  [provider]  [facility] injection  12/25/21   [provider]  traZODone (DESYREL) 150 MG tablet Take 150 mg by mouth at bedtime. 11/04/21   [provider]  vitamin B-12 (CYANOCOBALAMIN) 1000 MCG tablet Take 1,000 mcg by mouth daily.    [provider]  zolpidem (AMBIEN) 5 MG tablet Take 5 mg by mouth at bedtime as needed. 11/19/21   [provider]    Family History Family History  Problem Relation Age of Onset   Breast cancer Sister 58   Breast cancer Maternal Aunt    Breast cancer Other    Breast cancer Other     Social History Social History   Tobacco Use   Smoking status: Never   Smokeless  tobacco: Never  Vaping Use   Vaping Use: Never used  Substance Use Topics   Alcohol use: No   Drug use: Never     Allergies   Celebrex [celecoxib], Rofecoxib, and Triamterene-hydrochlorothiazide [hydrochlorothiazide w-triamterene]   Review of Systems Review of Systems   Physical Exam Triage Vital Signs ED Triage Vitals [04/18/22 0916]  Enc Vitals Group     BP (!) 143/79     Pulse Rate 65     Resp 14     Temp 97.9 F (36.6 C)     Temp src      SpO2 94 %     Weight      Height  Head Circumference      Peak Flow      Pain Score 0     Pain Loc      Pain Edu?      Excl. in [place]?    No data found.  Updated Vital Signs BP (!) 143/79   Pulse 65   Temp 97.9 F (36.6 C)   Resp 14   SpO2 94%   Visual Acuity Right Eye Distance:   Left Eye Distance:   Bilateral Distance:    Right Eye Near:   Left Eye Near:    Bilateral Near:     Physical Exam   UC Treatments / Results  Labs (all labs ordered are listed, but only abnormal results are displayed) Labs Reviewed  POCT URINALYSIS DIP (MANUAL ENTRY) - Abnormal; Notable for the following components:      Result Value   Clarity, UA cloudy (*)    Blood, UA moderate (*)    Protein Ur, POC =30 (*)    Nitrite, UA Positive (*)    Leukocytes, UA Small (1+) (*)    All other components within normal limits  URINE CULTURE    EKG   Radiology No results found.  Procedures Procedures (including critical care time)  Medications Ordered in UC Medications - No data to display  Initial Impression / Assessment and Plan / UC Course  I have reviewed the triage vital signs and the nursing notes.  Pertinent labs & imaging results that were available during my care of the patient were reviewed by me and considered in my medical decision making (see chart for details).   UA is positive for small leuks, blood, nitrites.  Will treat again for acute cystitis with hematuria, this time with Keflex 4 times daily x5 days per  protocol.  Previously treated with Ceftin, stopped early because of intolerance.  Sending for culture to verify susceptibility.  Patient with history of renal insufficiency. Most recent GFR equals 50.   Final Clinical Impressions(s) / UC Diagnoses   Final diagnoses:  Acute cystitis with hematuria   Discharge Instructions   None    ED Prescriptions   None    PDMP not reviewed this encounter.   Rose Phi, [place] 04/18/22 [phone]

## 2022-04-18 NOTE — ED Triage Notes (Signed)
Pt presents w/ her son. Pt's son states the patient has been having increased urinary frequency for a few days. Pt.'s son is concerned for UTI.

## 2022-04-18 NOTE — Discharge Instructions (Addendum)
A sample of your urine has been sent to the lab for culture to verify susceptibility of the organism to the antibiotic described today.  Follow up here or with your primary care provider if your symptoms are worsening or not improving with treatment.

## 2022-04-20 LAB — URINE CULTURE: Culture: >=100000 — AB

## 2022-04-22 DIAGNOSIS — Z853 Personal history of malignant neoplasm of breast: Secondary | ICD-10-CM | POA: Diagnosis not present

## 2022-04-22 DIAGNOSIS — N390 Urinary tract infection, site not specified: Secondary | ICD-10-CM | POA: Diagnosis not present

## 2022-04-22 DIAGNOSIS — F03918 Unspecified dementia, unspecified severity, with other behavioral disturbance: Secondary | ICD-10-CM | POA: Diagnosis not present

## 2022-04-22 DIAGNOSIS — K5904 Chronic idiopathic constipation: Secondary | ICD-10-CM | POA: Diagnosis not present

## 2022-04-22 DIAGNOSIS — G2581 Restless legs syndrome: Secondary | ICD-10-CM | POA: Diagnosis not present

## 2022-04-22 DIAGNOSIS — F411 Generalized anxiety disorder: Secondary | ICD-10-CM | POA: Diagnosis not present

## 2022-05-11 DIAGNOSIS — M80072A Age-related osteoporosis with current pathological fracture, left ankle and foot, initial encounter for fracture: Secondary | ICD-10-CM | POA: Diagnosis not present

## 2022-05-11 DIAGNOSIS — F01511 Vascular dementia, unspecified severity, with agitation: Secondary | ICD-10-CM | POA: Diagnosis not present

## 2022-05-11 DIAGNOSIS — F0154 Vascular dementia, unspecified severity, with anxiety: Secondary | ICD-10-CM | POA: Diagnosis not present

## 2022-05-11 DIAGNOSIS — I129 Hypertensive chronic kidney disease with stage 1 through stage 4 chronic kidney disease, or unspecified chronic kidney disease: Secondary | ICD-10-CM | POA: Diagnosis not present

## 2022-05-11 DIAGNOSIS — N1832 Chronic kidney disease, stage 3b: Secondary | ICD-10-CM | POA: Diagnosis not present

## 2022-05-11 DIAGNOSIS — M1990 Unspecified osteoarthritis, unspecified site: Secondary | ICD-10-CM | POA: Diagnosis not present

## 2022-05-11 DIAGNOSIS — M810 Age-related osteoporosis without current pathological fracture: Secondary | ICD-10-CM | POA: Diagnosis not present

## 2022-05-11 DIAGNOSIS — I69818 Other symptoms and signs involving cognitive functions following other cerebrovascular disease: Secondary | ICD-10-CM | POA: Diagnosis not present

## 2022-05-19 DEATH — deceased

## 2022-07-03 IMAGING — MG DIGITAL DIAGNOSTIC BILAT W/ TOMO W/ CAD
8 of 14 series · 8 of 40 positions shown · non-contrast
Comparison: Previous exam(s).

CLINICAL DATA: 87-year-old female presenting for annual exam as
well as with diffuse intermittent right breast pain.

EXAM:
DIGITAL DIAGNOSTIC BILATERAL MAMMOGRAM WITH CAD AND TOMO
ULTRASOUND LEFT BREAST

[L CC synth-2D]
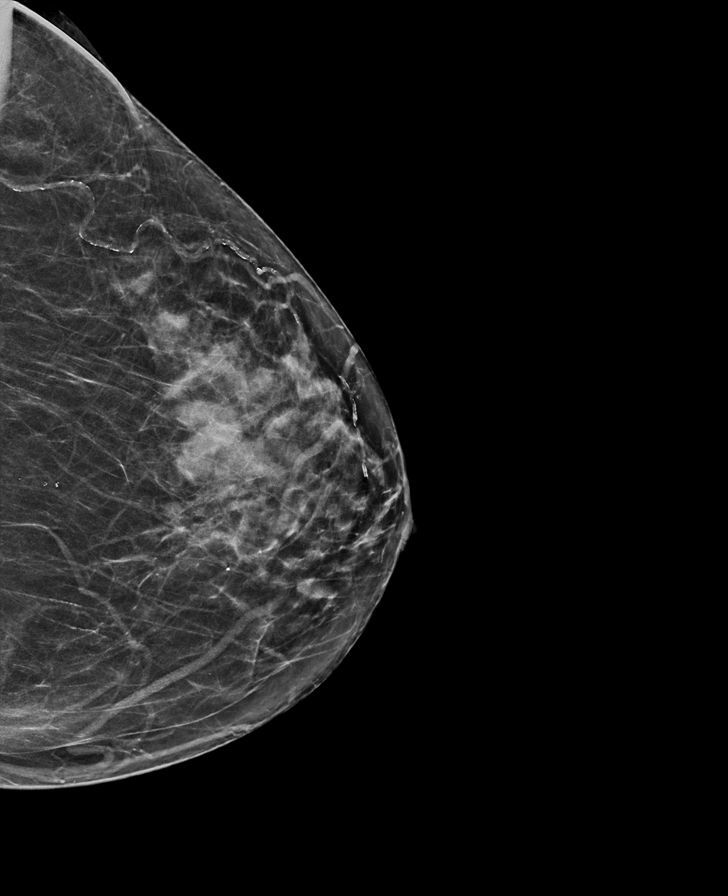

[R CC synth-2D]
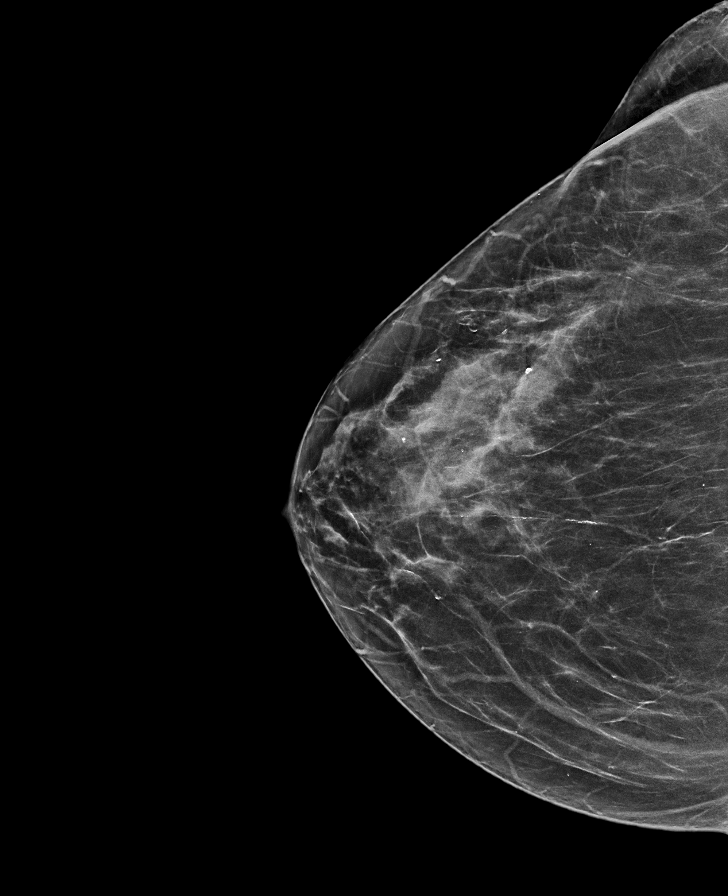

[L MLO synth-2D (1 of 3)]
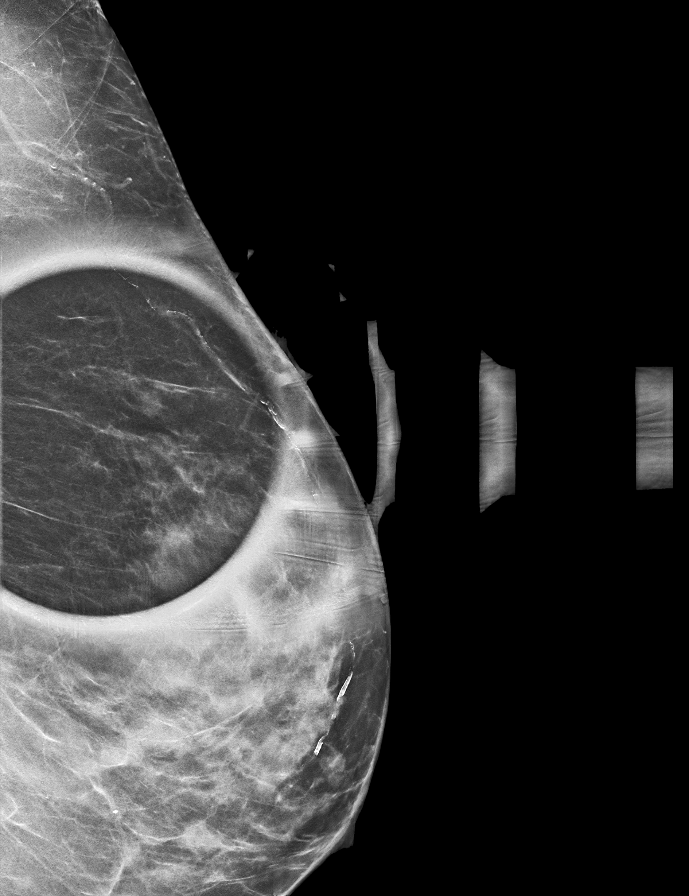

[L MLO synth-2D (2 of 3)]
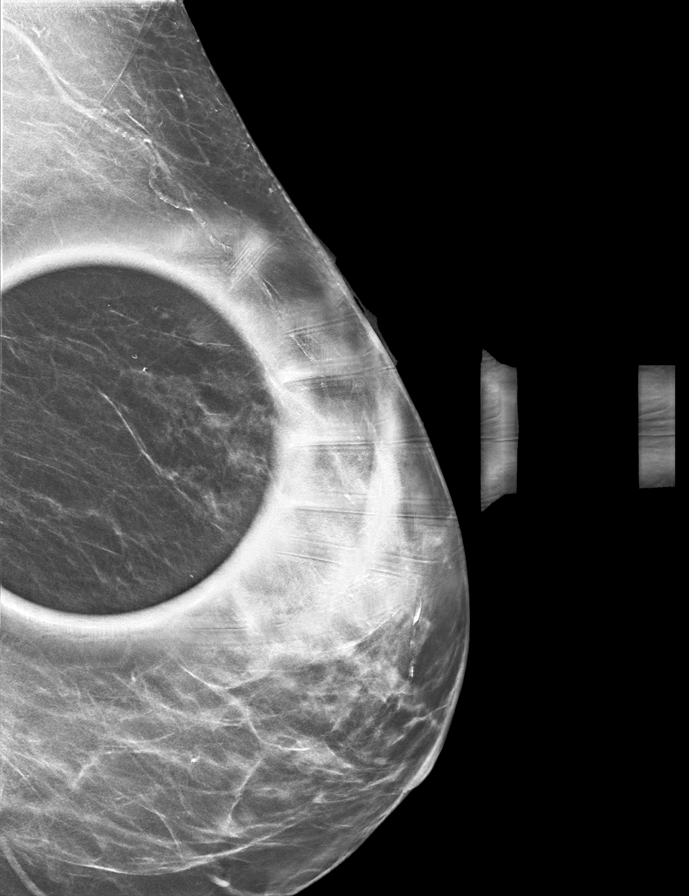

[R MLO synth-2D]
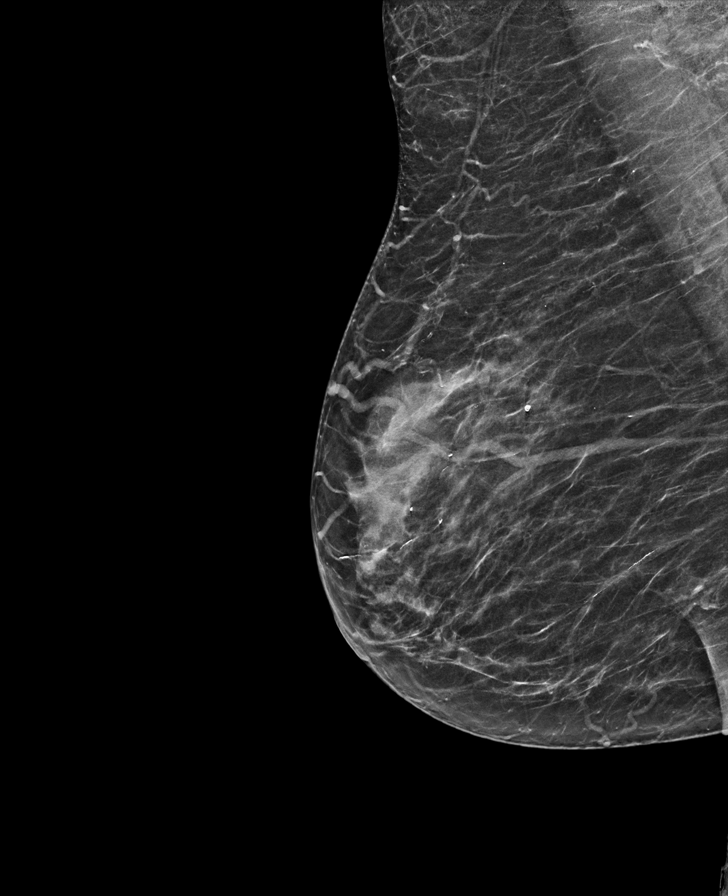

[L ML synth-2D]
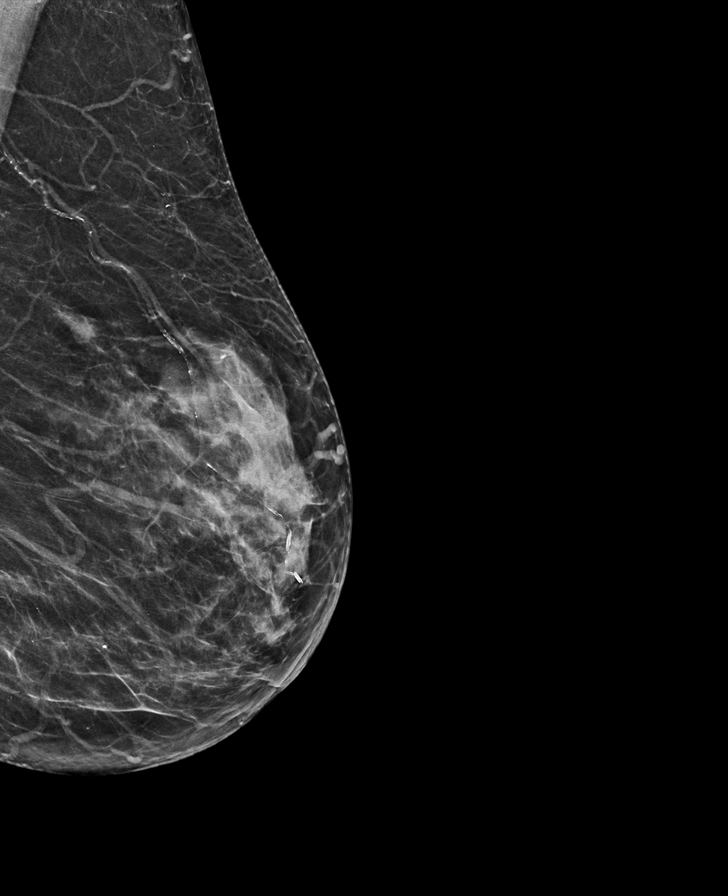

[L MLO synth-2D (3 of 3)]
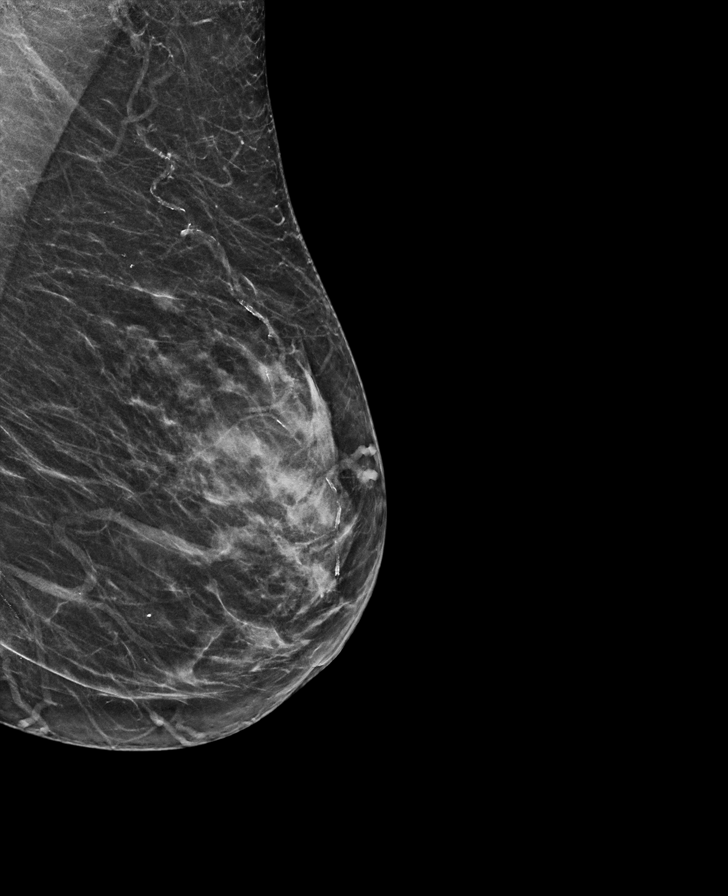

[L MLO tomo · tomo slice 28/55.0]
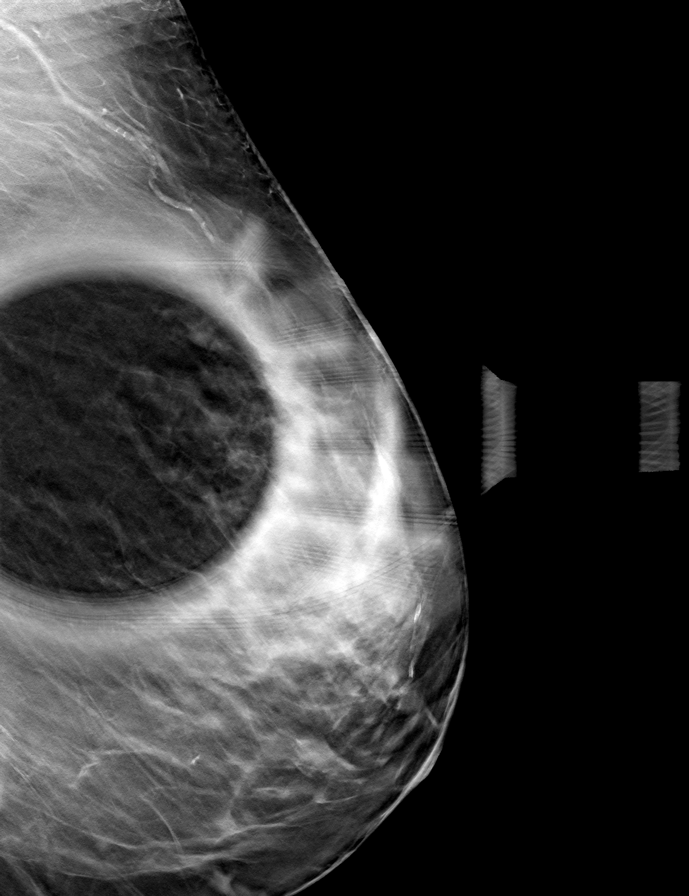

[8 of 40 positions shown; findings below may reference images not displayed]

ACR Breast Density Category c: The breast tissue is heterogeneously
dense, which may obscure small masses.
FINDINGS: Mammogram:

Right breast: No suspicious mass, distortion, or microcalcifications
are identified to suggest presence of malignancy.

Left breast: Spot compression and full field mL tomosynthesis views
of the left breast were performed in addition to standard views.
There is a new small irregular mass in the superior left breast
measuring approximately 0.5 cm, which persists on the additional
spot imaging.

Mammographic images were processed with CAD.

Ultrasound:

Targeted ultrasound is performed in the left breast at 2 o'clock 10
cm from the nipple demonstrating an irregular hypoechoic mass
measuring 0.5 x 0.5 x 0.5 cm. This corresponds to the mass
identified mammographically.

Targeted ultrasound of the left axilla demonstrates normal-appearing
lymph nodes.
IMPRESSION: 1. No mammographic evidence of malignancy in the right breast or
other finding to explain the patient's intermittent pain.

2. Suspicious small mass in the left breast at 2 o'clock measuring
0.5 cm.

RECOMMENDATION:
1. Ultrasound-guided core needle biopsy of the left breast mass at 2
o'clock.

2.  Clinical follow-up as needed for the right breast pain.

I have discussed the findings and recommendations with the patient
who agrees to proceed with biopsy. If applicable, a reminder letter
will be sent to the patient regarding the next appointment.

BI-RADS CATEGORY  4: Suspicious.

## 2022-07-03 IMAGING — US US BREAST*L* LIMITED INC AXILLA
1 series · 6 of 6 positions shown · non-contrast
Comparison: Previous exam(s).

CLINICAL DATA: 87-year-old female presenting for annual exam as
well as with diffuse intermittent right breast pain.

EXAM:
DIGITAL DIAGNOSTIC BILATERAL MAMMOGRAM WITH CAD AND TOMO
ULTRASOUND LEFT BREAST

[Series 1: us breast*left* limited inc axilla · 0.06mm/px · 6 of 6 slices shown]
[im 1/6]
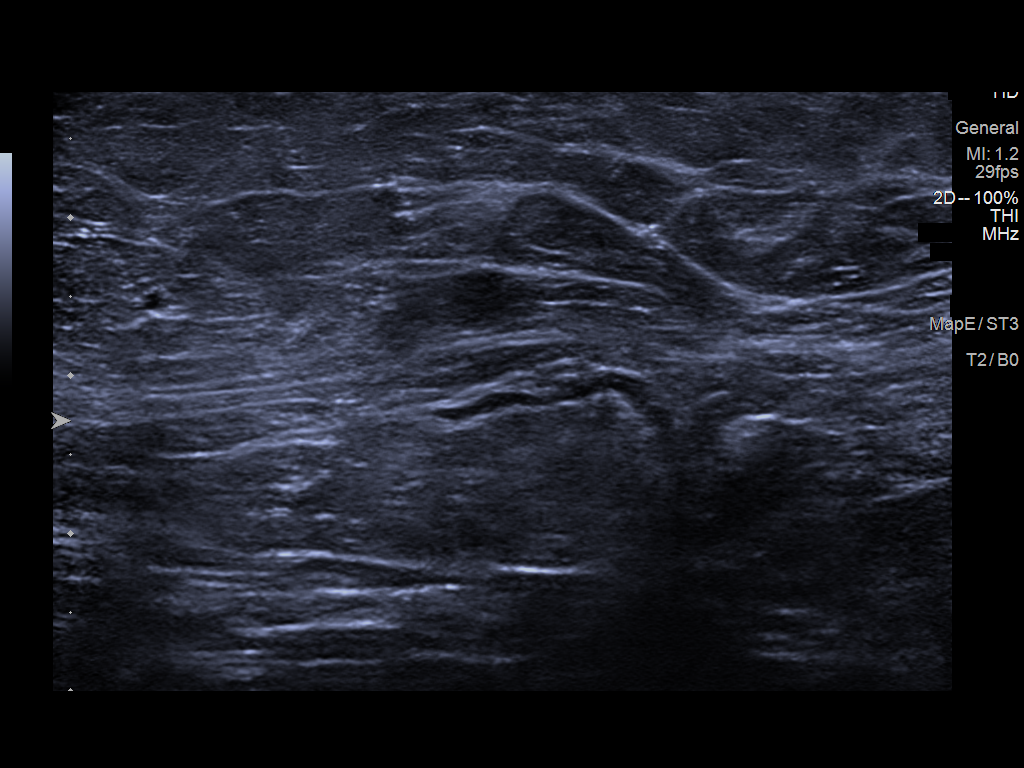
[im 2/6]
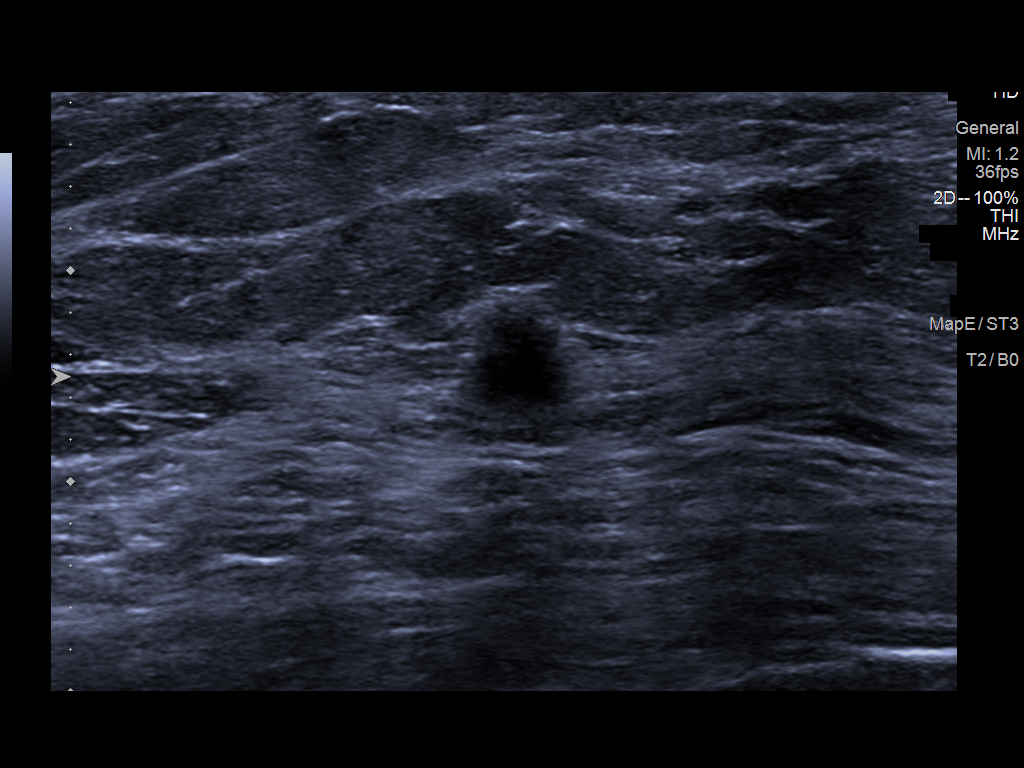
[im 3/6]
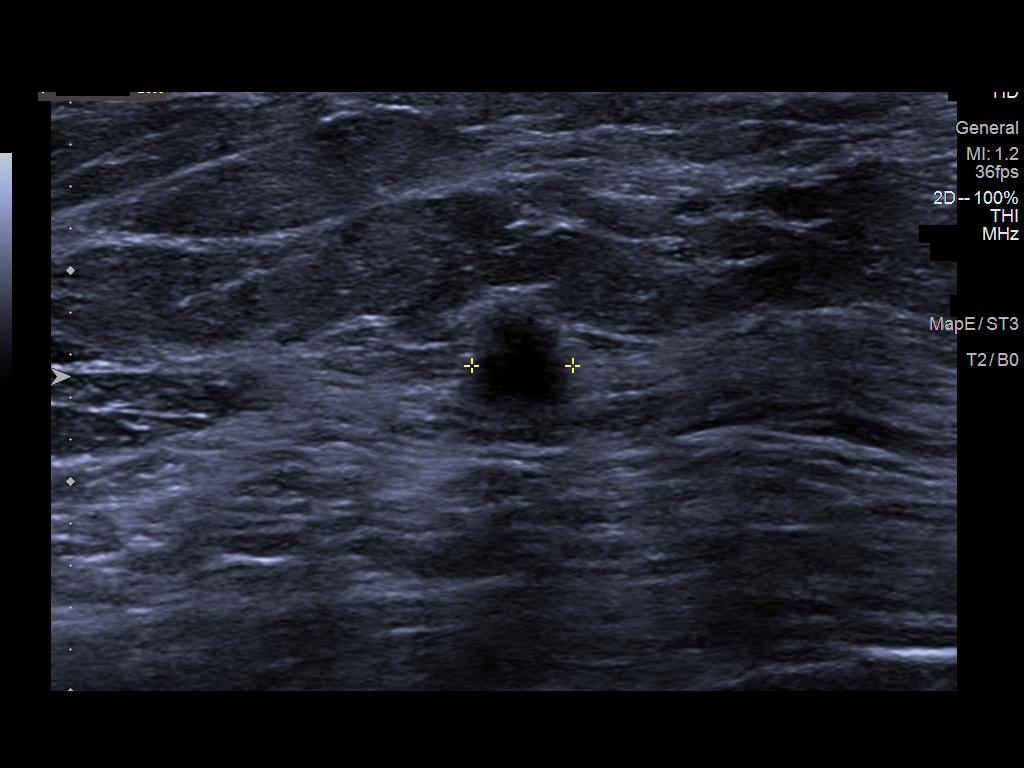
[im 4/6]
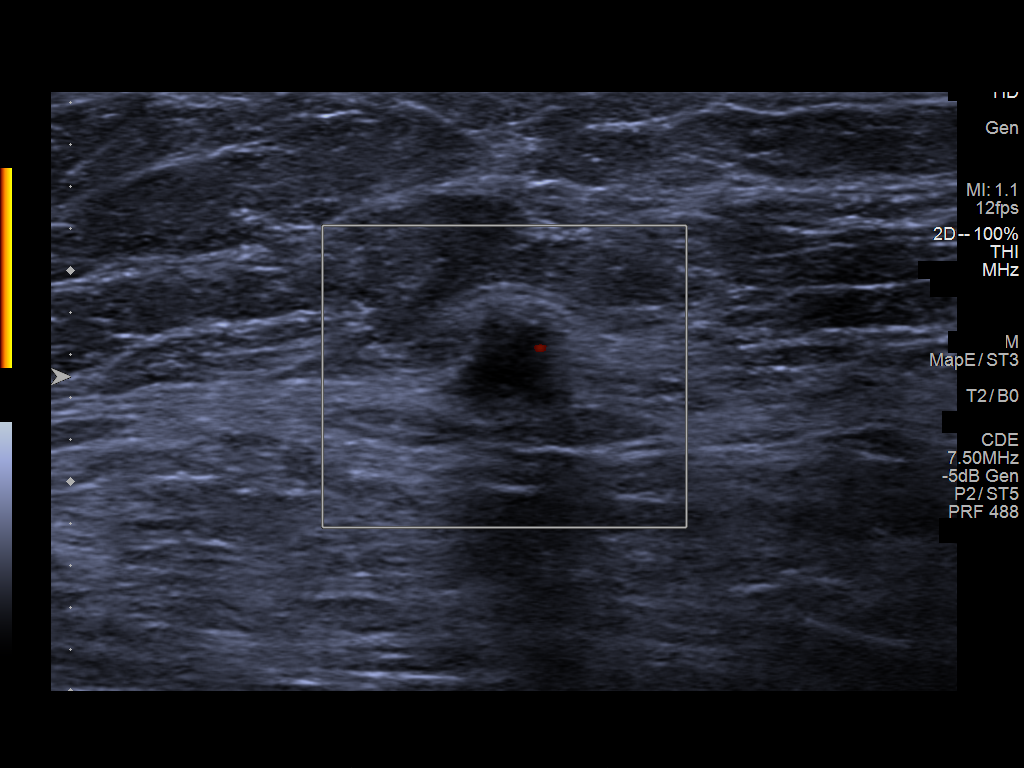
[im 5/6]
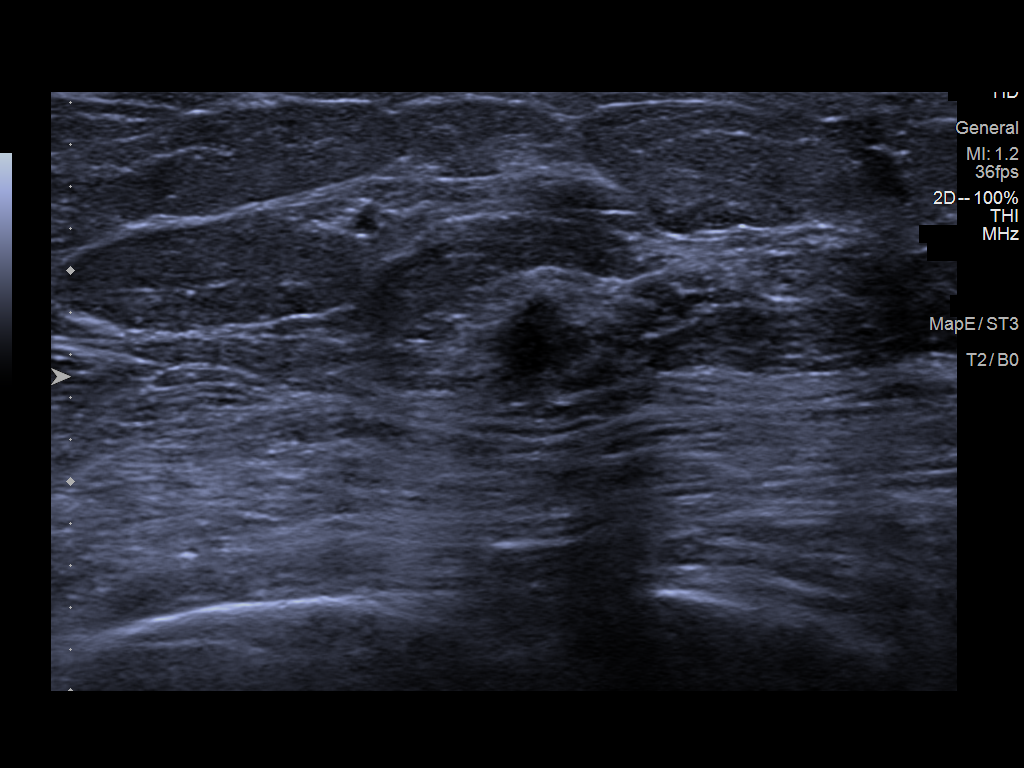
[im 6/6]
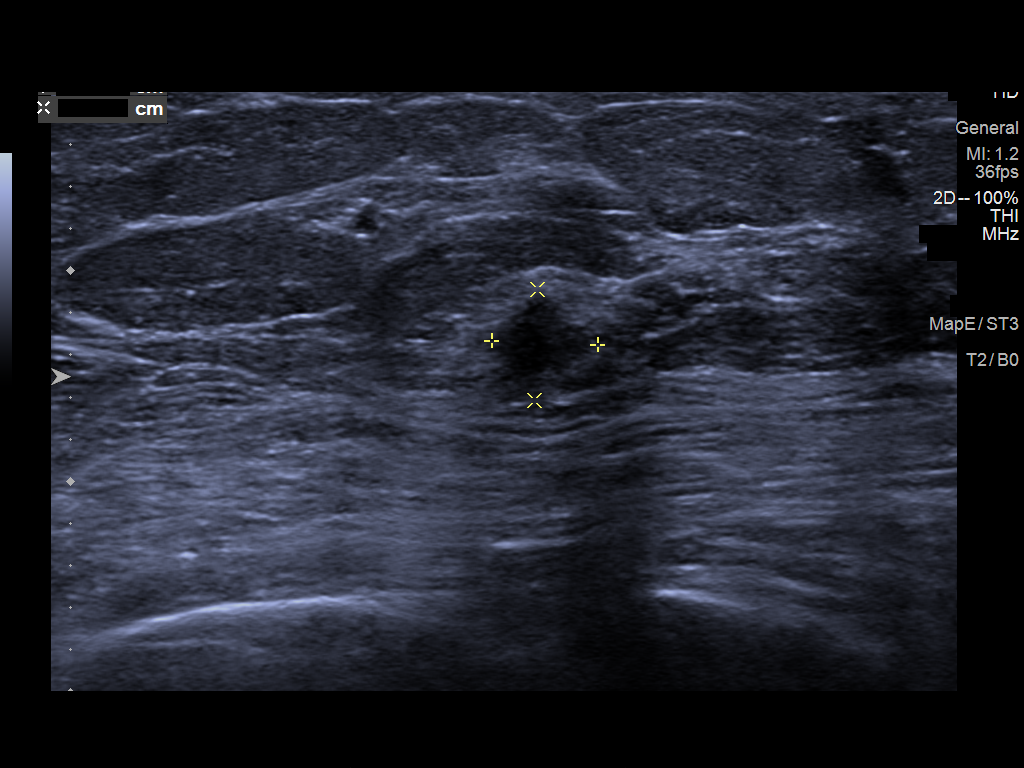

[6 of 6 positions shown; findings below may reference images not displayed]

ACR Breast Density Category c: The breast tissue is heterogeneously
dense, which may obscure small masses.
FINDINGS: Mammogram:

Right breast: No suspicious mass, distortion, or microcalcifications
are identified to suggest presence of malignancy.

Left breast: Spot compression and full field mL tomosynthesis views
of the left breast were performed in addition to standard views.
There is a new small irregular mass in the superior left breast
measuring approximately 0.5 cm, which persists on the additional
spot imaging.

Mammographic images were processed with CAD.

Ultrasound:

Targeted ultrasound is performed in the left breast at 2 o'clock 10
cm from the nipple demonstrating an irregular hypoechoic mass
measuring 0.5 x 0.5 x 0.5 cm. This corresponds to the mass
identified mammographically.

Targeted ultrasound of the left axilla demonstrates normal-appearing
lymph nodes.
IMPRESSION: 1. No mammographic evidence of malignancy in the right breast or
other finding to explain the patient's intermittent pain.

2. Suspicious small mass in the left breast at 2 o'clock measuring
0.5 cm.

RECOMMENDATION:
1. Ultrasound-guided core needle biopsy of the left breast mass at 2
o'clock.

2.  Clinical follow-up as needed for the right breast pain.

I have discussed the findings and recommendations with the patient
who agrees to proceed with biopsy. If applicable, a reminder letter
will be sent to the patient regarding the next appointment.

BI-RADS CATEGORY  4: Suspicious.
